# Patient Record
Sex: Male | Born: 1990 | Hispanic: No | Marital: Single | Smoking: Current every day smoker
Health system: Southern US, Community
[De-identification: ages and names within clinical notes are randomized; demographics above are authoritative.]

## PROBLEM LIST (undated history)

## (undated) DIAGNOSIS — G939 Disorder of brain, unspecified: Secondary | ICD-10-CM

## (undated) DIAGNOSIS — F3189 Other bipolar disorder: Secondary | ICD-10-CM

## (undated) DIAGNOSIS — F840 Autistic disorder: Secondary | ICD-10-CM

## (undated) DIAGNOSIS — F419 Anxiety disorder, unspecified: Secondary | ICD-10-CM

## (undated) DIAGNOSIS — R6250 Unspecified lack of expected normal physiological development in childhood: Secondary | ICD-10-CM

## (undated) DIAGNOSIS — F4310 Post-traumatic stress disorder, unspecified: Secondary | ICD-10-CM

## (undated) DIAGNOSIS — F319 Bipolar disorder, unspecified: Secondary | ICD-10-CM

## (undated) DIAGNOSIS — J45909 Unspecified asthma, uncomplicated: Secondary | ICD-10-CM

## (undated) HISTORY — PX: SHOULDER SURGERY: SHX246

## (undated) HISTORY — PX: HERNIA REPAIR: SHX51

---
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
## 2015-07-29 ENCOUNTER — Encounter: Payer: Self-pay | Admitting: Emergency Medicine

## 2015-07-29 ENCOUNTER — Emergency Department
Admission: EM | Admit: 2015-07-29 | Discharge: 2015-07-29 | Disposition: A | Discharge status: Home / Self Care | Payer: Medicaid Other | Attending: Emergency Medicine | Admitting: Emergency Medicine

## 2015-07-29 DIAGNOSIS — F17210 Nicotine dependence, cigarettes, uncomplicated: Secondary | ICD-10-CM | POA: Diagnosis not present

## 2015-07-29 DIAGNOSIS — F319 Bipolar disorder, unspecified: Secondary | ICD-10-CM | POA: Diagnosis not present

## 2015-07-29 DIAGNOSIS — M222X1 Patellofemoral disorders, right knee: Secondary | ICD-10-CM | POA: Diagnosis not present

## 2015-07-29 DIAGNOSIS — M25561 Pain in right knee: Secondary | ICD-10-CM | POA: Diagnosis present

## 2015-07-29 DIAGNOSIS — Z79899 Other long term (current) drug therapy: Secondary | ICD-10-CM | POA: Insufficient documentation

## 2015-07-29 DIAGNOSIS — J45909 Unspecified asthma, uncomplicated: Secondary | ICD-10-CM | POA: Insufficient documentation

## 2015-07-29 HISTORY — DX: Unspecified asthma, uncomplicated: J45.909

## 2015-07-29 HISTORY — DX: Bipolar disorder, unspecified: F31.9

## 2015-07-29 HISTORY — DX: Unspecified lack of expected normal physiological development in childhood: R62.50

## 2015-07-29 HISTORY — DX: Other bipolar disorder: F31.89

## 2015-07-29 MED ORDER — MELOXICAM 15 MG PO TABS: 15.0000 mg | ORAL_TABLET | Freq: Every day | ORAL | Status: DC

## 2015-07-29 NOTE — ED Notes (Signed)
Having generalized right knee pain w/o injury for 2 days

## 2015-07-29 NOTE — Discharge Instructions (Signed)
Patellofemoral Pain Syndrome  Patellofemoral pain syndrome is a condition that involves a softening or breakdown of the tissue (cartilage) on the underside of your kneecap (patella). This causes pain in the front of the knee. The condition is also called runner's knee or chondromalacia patella. Patellofemoral pain syndrome is most common in young adults who are active in sports.  Your knee is the largest joint in your body. The patella covers the front of your knee and is attached to muscles above and below your knee. The underside of the patella is covered with a smooth type of cartilage (synovium). The smooth surface helps the patella glide easily when you move your knee. Patellofemoral pain syndrome causes swelling in the joint linings and bone surfaces in your knee.   CAUSES   Patellofemoral pain syndrome can be caused by:   Overuse.   Poor alignment of your knee joints.   Weak leg muscles.   A direct blow to your kneecap.  RISK FACTORS  You may be at risk for patellofemoral pain syndrome if you:   Do a lot of activities that can wear down your kneecap. These include:    Running.    Squatting.    Climbing stairs.   Start a new physical activity or exercise program.   Wear shoes that do not fit well.   Do not have good leg strength.   Are overweight.  SIGNS AND SYMPTOMS   Knee pain is the most common symptom of patellofemoral pain syndrome. This may feel like a dull, aching pain underneath your patella, in the front of your knee. There may be a popping or cracking sound when you move your knee. Pain may get worse with:   Exercise.   Climbing stairs.   Running.   Jumping.   Squatting.   Kneeling.   Sitting for a long time.   Moving or pushing on your patella.  DIAGNOSIS   Your health care provider may be able to diagnose patellofemoral pain syndrome from your symptoms and medical history. You may be asked about your recent physical activities and which ones cause knee pain. Your health care  provider may do a physical exam with certain tests to confirm the diagnosis. These may include:   Moving your patella back and forth.   Checking your range of knee motion.   Having you squat or jump to see if you have pain.   Checking the strength of your leg muscles.  An MRI of the knee may also be done.  TREATMENT   Patellofemoral pain syndrome can usually be treated at home with rest, ice, compression, and elevation (RICE). Other treatments may include:   Nonsteroidal anti-inflammatory drugs (NSAIDs).   Physical therapy to stretch and strengthen your leg muscles.   Shoe inserts (orthotics) to take stress off your knee.   A knee brace or knee support.   Surgery to remove damaged cartilage or move the patella to a better position. The need for surgery is rare.  HOME CARE INSTRUCTIONS    Take medicines only as directed by your health care provider.   Rest your knee.   When resting, keep your knee raised above the level of your heart.   Avoid activities that cause knee pain.   Apply ice to the injured area:   Put ice in a plastic bag.   Place a towel between your skin and the bag.   Leave the ice on for 20 minutes, 2-3 times a day.   Use splints,   braces, knee supports, or walking aids as directed by your health care provider.   Perform stretching and strengthening exercises as directed by your health care provider or physical therapist.   Keep all follow-up visits as directed by your health care provider. This is important.  SEEK MEDICAL CARE IF:    Your symptoms get worse.   You are not improving with home care.  MAKE SURE YOU:   Understand these instructions.   Will watch your condition.   Will get help right away if you are not doing well or get worse.     This information is not intended to replace advice given to you by your health care provider. Make sure you discuss any questions you have with your health care provider.     Document Released: 02/25/2009 Document Revised: 03/30/2014  Document Reviewed: 05/29/2013  Elsevier Interactive Patient Education 2016 [employer]

## 2015-07-29 NOTE — ED Provider Notes (Signed)
[facility] Emergency Department Provider Note ____________________________________________  Time seen: Approximately 5:49 PM  I have reviewed the triage vital signs and the nursing notes.   HISTORY  Chief Complaint Knee Pain    HPI Jerry Benton is a 25 y.o. male who presents to the emergency department for evaluation of right knee pain. He denies injury. He states that the pain started suddenly 2-3 days ago after jumping on a trampoline. He has not taken anything to try and ease the pain. He denies previous knee pain or injury.  Past Medical History  Diagnosis Date  . Asthma   . Bipolar 1 disorder (HCC)   . Developmental delay   . 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   Past Surgical History  Procedure Laterality Date  . Shoulder surgery    . Hernia repair      Current Outpatient Rx  Name  Route  Sig  Dispense  Refill  . paliperidone (INVEGA) 9 MG 24 hr tablet   Oral   Take 9 mg by mouth every morning.         . traZODone (DESYREL) 100 MG tablet   Oral   Take 100 mg by mouth at bedtime.         . meloxicam (MOBIC) 15 MG tablet   Oral   Take 1 tablet (15 mg total) by mouth daily.   30 tablet   0     Allergies Lamictal and Lithium  No family history on file.  Social History Social History  Substance Use Topics  . Smoking status: Current Every Day Smoker -- 1.00 packs/day    Types: Cigarettes  . Smokeless tobacco: None  . Alcohol Use: No    Review of Systems Constitutional: No recent illness. Cardiovascular: Denies chest pain or palpitations. Respiratory: Denies shortness of breath. Musculoskeletal: Pain in Right knee Skin: Negative for rash, wound, lesion. Neurological: Negative for focal weakness or numbness.  ____________________________________________   PHYSICAL EXAM:  VITAL SIGNS: ED Triage Vitals  Enc Vitals Group     BP --       Pulse --      Resp --      Temp --      Temp src --      SpO2 --      Weight 07/29/15 1706 166 lb (75.297 kg)     Height 07/29/15 1706  (1.676 m)     Head Cir --      Peak Flow --      Pain Score 07/29/15 1706 8     Pain Loc --      Pain Edu? --      Excl. in GC? --     Constitutional: Alert and oriented. Well appearing and in no acute distress. Eyes: Conjunctivae are normal. EOMI. Head: Atraumatic. Neck: No stridor.  Respiratory: Normal respiratory effort.   Musculoskeletal: Full range of motion of the right knee, however the patella does not appear to track midline. There is no laxity in the joint. Neurologic:  Normal speech and language. No gross focal neurologic deficits are appreciated. Speech is normal. No gait instability. Skin:  Skin is warm, dry and intact. Atraumatic. Psychiatric: Mood and affect are normal. Speech and behavior are normal.  ____________________________________________   LABS (all labs ordered are listed, but only abnormal results are displayed)  Labs Reviewed - No data to display ____________________________________________  RADIOLOGY  Not indicated ____________________________________________   PROCEDURES  Procedure(s) performed: None   ____________________________________________   INITIAL IMPRESSION / ASSESSMENT AND PLAN / ED COURSE  Pertinent labs & imaging results that were available during my care of the patient were reviewed by me and considered in my medical decision making (see chart for details).  Patient was given a prescription for meloxicam and advised to purchase a knee sleeve. Patient was advised to follow-up with orthopedics for symptoms that are not improving over the next couple weeks. He was encouraged to return to the emergency department for symptoms change or worsen if he is unable schedule an appointment. Patient was able to ambulate out of the department with a steady gait in  assisted. ____________________________________________   FINAL CLINICAL IMPRESSION(S) / ED DIAGNOSES  Final diagnoses:  Patellofemoral syndrome, right       Chinita PesterCari B Katrell Milhorn, FNP 07/30/15 0019  Loleta Roseory Forbach, MD 07/30/15 [phone]

## 2015-07-29 NOTE — ED Notes (Signed)
Right knee pain that began a few days ago, states ibu and tylenol did not help, states swellling as well, denies injury to area. Walked to triage with no limp.

## 2015-08-14 ENCOUNTER — Encounter: Payer: Self-pay | Admitting: Emergency Medicine

## 2015-08-14 ENCOUNTER — Emergency Department
Admission: EM | Admit: 2015-08-14 | Discharge: 2015-08-15 | Disposition: A | Discharge status: Home / Self Care | Payer: Medicaid Other | Attending: Emergency Medicine | Admitting: Emergency Medicine

## 2015-08-14 DIAGNOSIS — Z791 Long term (current) use of non-steroidal anti-inflammatories (NSAID): Secondary | ICD-10-CM | POA: Diagnosis not present

## 2015-08-14 DIAGNOSIS — F4325 Adjustment disorder with mixed disturbance of emotions and conduct: Secondary | ICD-10-CM | POA: Diagnosis not present

## 2015-08-14 DIAGNOSIS — F17210 Nicotine dependence, cigarettes, uncomplicated: Secondary | ICD-10-CM | POA: Diagnosis not present

## 2015-08-14 DIAGNOSIS — R451 Restlessness and agitation: Secondary | ICD-10-CM | POA: Diagnosis not present

## 2015-08-14 DIAGNOSIS — F319 Bipolar disorder, unspecified: Secondary | ICD-10-CM | POA: Diagnosis not present

## 2015-08-14 DIAGNOSIS — F1210 Cannabis abuse, uncomplicated: Secondary | ICD-10-CM

## 2015-08-14 DIAGNOSIS — F3110 Bipolar disorder, current episode manic without psychotic features, unspecified: Secondary | ICD-10-CM

## 2015-08-14 DIAGNOSIS — R6250 Unspecified lack of expected normal physiological development in childhood: Secondary | ICD-10-CM

## 2015-08-14 DIAGNOSIS — F918 Other conduct disorders: Secondary | ICD-10-CM | POA: Diagnosis present

## 2015-08-14 LAB — CBC WITH DIFFERENTIAL/PLATELET
Basophils Absolute: 0 10*3/uL (ref 0–0.1)
Basophils Relative: 0 %
Eosinophils Absolute: 0.1 10*3/uL (ref 0–0.7)
Eosinophils Relative: 1 %
HEMATOCRIT: 41.7 % (ref 40.0–52.0)
HEMOGLOBIN: 14 g/dL (ref 13.0–18.0)
LYMPHS ABS: 1.6 10*3/uL (ref 1.0–3.6)
Lymphocytes Relative: 15 %
MCH: 26.6 pg (ref 26.0–34.0)
MCHC: 33.6 g/dL (ref 32.0–36.0)
MCV: 79.1 fL — ABNORMAL LOW (ref 80.0–100.0)
Monocytes Absolute: 0.8 10*3/uL (ref 0.2–1.0)
NEUTROS ABS: 7.7 10*3/uL — AB (ref 1.4–6.5)
Platelets: 149 10*3/uL — ABNORMAL LOW (ref 150–440)
RBC: 5.27 MIL/uL (ref 4.40–5.90)
RDW: 13.8 % (ref 11.5–14.5)
WBC: 10.1 10*3/uL (ref 3.8–10.6)

## 2015-08-14 LAB — COMPREHENSIVE METABOLIC PANEL
ALBUMIN: 4.3 g/dL (ref 3.5–5.0)
ALK PHOS: 86 U/L (ref 38–126)
ALT: 33 U/L (ref 17–63)
ANION GAP: 6 (ref 5–15)
AST: 40 U/L (ref 15–41)
BILIRUBIN TOTAL: 0.4 mg/dL (ref 0.3–1.2)
BUN: 9 mg/dL (ref 6–20)
CALCIUM: 9.1 mg/dL (ref 8.9–10.3)
CO2: 28 mmol/L (ref 22–32)
CREATININE: 0.86 mg/dL (ref 0.61–1.24)
Chloride: 106 mmol/L (ref 101–111)
GFR calc non Af Amer: >60 mL/min (ref >60)
GLUCOSE: 87 mg/dL (ref 65–99)
Potassium: 3.9 mmol/L (ref 3.5–5.1)
Sodium: 140 mmol/L (ref 135–145)
TOTAL PROTEIN: 7.4 g/dL (ref 6.5–8.1)

## 2015-08-14 LAB — URINE DRUG SCREEN, QUALITATIVE (ARMC ONLY)
AMPHETAMINES, UR SCREEN: NOT DETECTED
BARBITURATES, UR SCREEN: NOT DETECTED
BENZODIAZEPINE, UR SCRN: POSITIVE — AB
Cannabinoid 50 Ng, Ur ~~LOC~~: POSITIVE — AB
Cocaine Metabolite,Ur ~~LOC~~: NOT DETECTED
MDMA (Ecstasy)Ur Screen: NOT DETECTED
Methadone Scn, Ur: NOT DETECTED
OPIATE, UR SCREEN: NOT DETECTED
PHENCYCLIDINE (PCP) UR S: NOT DETECTED
Tricyclic, Ur Screen: NOT DETECTED

## 2015-08-14 LAB — ETHANOL

## 2015-08-14 MED ORDER — ZIPRASIDONE MESYLATE 20 MG IM SOLR
20.0000 mg | Freq: Once | INTRAMUSCULAR | Status: AC
  Administered 2015-08-14: 20 mg via INTRAMUSCULAR
  Filled 2015-08-14: qty 20

## 2015-08-14 MED ORDER — DIAZEPAM 5 MG PO TABS
10.0000 mg | ORAL_TABLET | Freq: Once | ORAL | Status: AC
  Administered 2015-08-14: 10 mg via ORAL
  Filled 2015-08-14: qty 2

## 2015-08-14 NOTE — ED Notes (Addendum)
Pt states "I'm walking out of here in 30 minutes, I want my stuff back, I got people to text." Pt cursing, becoming agitated. Pt stating "I don't need to be here, I just got mad." This RN talked to pt, pt cursed at this RN and raised voice.

## 2015-08-14 NOTE — BHH Counselor (Signed)
1st Attempt - Unsuccessful attempt to assess pt due to pt being asleep.   Per Isla PenceQuad Nurse Florentina Addison(Katie, RN), pt exhibiting verbal aggressive behaviors.

## 2015-08-14 NOTE — ED Provider Notes (Addendum)
[facility] Emergency Department Provider Note        Time seen: ----------------------------------------- 6:41 PM on 08/14/2015 -----------------------------------------    I have reviewed the triage vital signs and the nursing notes.   HISTORY  Chief Complaint Aggressive Behavior    HPI Jerry Benton is a 25 y.o. male who presents to ER after his mother called police to 10 throwing things in her house. Patient reports she is off his medications and he is angry because there is a girl in the neighborhood that is accusing him of things that he did not do. Patient reports he is taking his medicines although his mother disputes this. He arrives agitated. He threw a cell phone across the room on arrival.   Past Medical History  Diagnosis Date  . Asthma   . Bipolar 1 disorder (HCC)   . Developmental delay   . 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   Past Surgical History  Procedure Laterality Date  . Shoulder surgery    . Hernia repair      Allergies Lamictal and Lithium  Social History Social History  Substance Use Topics  . Smoking status: Current Every Day Smoker -- 1.00 packs/day    Types: Cigarettes  . Smokeless tobacco: None  . Alcohol Use: No    Review of Systems Constitutional: Negative for fever. Cardiovascular: Negative for chest pain. Respiratory: Negative for shortness of breath. Gastrointestinal: Negative for abdominal pain, vomiting and diarrhea. Genitourinary: Negative for dysuria. Musculoskeletal: Negative for back pain. Skin: Negative for rash. Neurological: Negative for headaches, focal weakness or numbness. Psychiatric: Positive for agitation  10-point ROS otherwise negative.  ____________________________________________   PHYSICAL EXAM:  VITAL SIGNS: ED Triage Vitals  Enc Vitals Group     BP 08/14/15 1833 124/69 mmHg     Pulse  Rate 08/14/15 1833 92     Resp 08/14/15 1833 16     Temp 08/14/15 1833 98 F (36.7 C)     Temp Source 08/14/15 1833 Oral     SpO2 08/14/15 1833 97 %     Weight 08/14/15 1833 152 lb (68.947 kg)     Height 08/14/15 1833 5\' 6"  (1.676 m)     Head Cir --      Peak Flow --      Pain Score --      Pain Loc --      Pain Edu? --      Excl. in GC? --     Constitutional: Alert and oriented, Agitated Eyes: Conjunctivae are normal. PERRL. Normal extraocular movements. ENT   Head: Normocephalic and atraumatic.   Nose: No congestion/rhinnorhea.   Mouth/Throat: Mucous membranes are moist.   Neck: No stridor. Cardiovascular: Normal rate, regular rhythm. No murmurs, rubs, or gallops. Respiratory: Normal respiratory effort without tachypnea nor retractions. Breath sounds are clear and equal bilaterally. No wheezes/rales/rhonchi. Gastrointestinal: Soft and nontender. Normal bowel sounds Musculoskeletal: Nontender with normal range of motion in all extremities. No lower extremity tenderness nor edema. Neurologic:  Normal speech and language. No gross focal neurologic deficits are appreciated.  Skin:  Skin is warm, dry and intact. No rash noted. Psychiatric: Elevated mood and affect, confrontational ____________________________________________  ED COURSE:  Pertinent labs & imaging results that were available during my care of the patient were reviewed by me and considered in my medical decision making (see chart for details). Patient presents with aggressive behavior. He'll  receive oral Valium, I will consult psychiatry. ____________________________________________    LABS (pertinent positives/negatives)  Labs Reviewed  CBC WITH DIFFERENTIAL/PLATELET - Abnormal; Notable for the following:    MCV 79.1 (*)    Platelets 149 (*)    Neutro Abs 7.7 (*)    All other components within normal limits  URINE DRUG SCREEN, QUALITATIVE ([facility] ONLY) - Abnormal; Notable for the following:     Cannabinoid 50 Ng, Ur [place] POSITIVE (*)    Benzodiazepine, Ur Scrn POSITIVE (*)    All other components within normal limits  COMPREHENSIVE METABOLIC PANEL  ETHANOL   ____________________________________________  FINAL ASSESSMENT AND PLAN  Agitation, bipolar disorder  Plan: Patient with labs as dictated above. Patient is medically stable for psychiatric evaluation and disposition.   Emily Filbert, MD   Note: This dictation was prepared with Dragon dictation. Any transcriptional errors that result from this process are unintentional   Emily Filbert, MD 08/14/15 1846  Emily Filbert, MD 08/14/15 [phone]

## 2015-08-14 NOTE — ED Notes (Signed)
Pt here due to mother calling police because pt was throwing stuff in her house. Reports pt is off his meds. Pt reports he's angry because there's a neighborhood dispute, reports he's been taking his medications as prescribed. Pt alert upon arrival, agitated. Pt threw cell phone across room.

## 2015-08-15 DIAGNOSIS — R6250 Unspecified lack of expected normal physiological development in childhood: Secondary | ICD-10-CM

## 2015-08-15 DIAGNOSIS — F1210 Cannabis abuse, uncomplicated: Secondary | ICD-10-CM

## 2015-08-15 DIAGNOSIS — F4325 Adjustment disorder with mixed disturbance of emotions and conduct: Secondary | ICD-10-CM | POA: Diagnosis not present

## 2015-08-15 DIAGNOSIS — F319 Bipolar disorder, unspecified: Secondary | ICD-10-CM

## 2015-08-15 NOTE — ED Provider Notes (Addendum)
Dr. Mat Carnelay packs sees the patient. he is calm and not a threat to anyone Dr. Mat Carnelay packs we'll discharge patient back to his care under the act team.  Arnaldo NatalPaul F Malinda, MD 08/15/15 1157  Arnaldo NatalPaul F Malinda, MD 08/15/15 [phone]

## 2015-08-15 NOTE — ED Notes (Signed)
BEHAVIORAL HEALTH ROUNDING Patient sleeping: No. Patient alert and oriented: yes Behavior appropriate: Yes.  ; If no, describe:  Nutrition and fluids offered: yes Toileting and hygiene offered: Yes  Sitter present: q15 minute observations and security  monitoring Law enforcement present: Yes  ODS  

## 2015-08-15 NOTE — ED Notes (Signed)
I was told by his mother that she will be here at 1430 to pick him up -  I have not heard from her  ACT team member came here instead and Jerry Benton spoke with her  - [facility]  ACT team member reports that she will be back at 1600 to get him and take him home

## 2015-08-15 NOTE — ED Provider Notes (Signed)
-----------------------------------------   8:15 AM on 08/15/2015 -----------------------------------------   Blood pressure 107/69, pulse 100, temperature 97.6 F (36.4 C), temperature source Oral, resp. rate 18, height 5\' 6"  (1.676 m), weight 152 lb (68.947 kg), SpO2 96 %.  The patient had no acute events since last update.  Calm and cooperative at this time.  Disposition is pending per Psychiatry/Behavioral Medicine team recommendations.     Arnaldo NatalPaul F Malinda, MD 08/15/15 [phone]

## 2015-08-15 NOTE — ED Notes (Signed)
Pt sleeping on stretcher, blanket covering pt, side rails up on both sides. Lights dimmed for comfort.

## 2015-08-15 NOTE — ED Notes (Signed)
Pt sleeping on back.

## 2015-08-15 NOTE — ED Notes (Signed)
Pt observed lying in bed - watching TV   Pt visualized with NAD  No verbalized needs or concerns at this time  Continue to monitor 

## 2015-08-15 NOTE — ED Notes (Signed)
Pt awake and ambulatory in hallway. Pt asked for something to drink, was provided with drink per request. Pt went back into room.

## 2015-08-15 NOTE — ED Notes (Signed)
Spoke with his mother on the phone -  He had informed her of his pending discharge  Mother verbalizing anger to me on the phone - I informed her that the pt has been cleared by the emergency psychiatrist and that he was safe to leave - pt remains voluntary  Mother reports that she will be here at approx 1430 to pick him up

## 2015-08-15 NOTE — ED Notes (Signed)
TTS consult completed  My assessment completed  He denies pain  I have provided him with the phone to call his mother  -    He can be heard yelling at his mother on the phone  - demanding irritated voice

## 2015-08-15 NOTE — ED Notes (Signed)
ED BHU PLACEMENT JUSTIFICATION Is the patient under IVC or is there intent for IVC:  Voluntary  Is the patient medically cleared: Yes.   Is there vacancy in the ED BHU: Yes.   Is the population mix appropriate for patient: Yes.   Is the patient awaiting placement in inpatient or outpatient setting:  Has the patient had a psychiatric consult:  Consult pending Survey of unit performed for contraband, proper placement and condition of furniture, tampering with fixtures in bathroom, shower, and each patient room: Yes.  ; Findings:  APPEARANCE/BEHAVIOR Demanding - slammed the door to the BR as he went in  NEURO ASSESSMENT Orientation: oriented x4  Denies pain Hallucinations: No.None noted (Hallucinations) Speech:  Loud - yelling at times Gait: normal RESPIRATORY ASSESSMENT Even  Unlabored respirations  CARDIOVASCULAR ASSESSMENT Pulses equal   regular rate  Skin warm and dry   GASTROINTESTINAL ASSESSMENT no GI complaint EXTREMITIES Full ROM  PLAN OF CARE Provide calm/safe environment. Vital signs assessed twice daily. ED BHU Assessment once each 12-hour shift. Collaborate with intake RN daily or as condition indicates. Assure the ED provider has rounded once each shift. Provide and encourage hygiene. Provide redirection as needed. Assess for escalating behavior; address immediately and inform ED provider.  Assess family dynamic and appropriateness for visitation as needed: Yes.  ; If necessary, describe findings:  Educate the patient/family about BHU procedures/visitation: Yes.  ; If necessary, describe findings:

## 2015-08-15 NOTE — Consult Note (Signed)
[facility] Face-to-Face Psychiatry Consult   Reason for Consult:  Consult for this 25 year old man brought in voluntarily after he became very angry and agitated at home Referring Physician:  Cinda Quest Patient Identification: Jerry Benton MRN:  [id number] Principal Diagnosis: Adjustment disorder with mixed disturbance of emotions and conduct Diagnosis:   Patient Active Problem List   Diagnosis Date Noted  . Bipolar 1 disorder ([place]) [F31.9] 08/15/2015  . Developmental delay [R62.50] 08/15/2015  . Adjustment disorder with mixed disturbance of emotions and conduct [F43.25] 08/15/2015  . Cannabis abuse [F12.10] 08/15/2015    Total Time spent with patient: 1 hour  Subjective:   Jerry Benton is a 25 y.o. male patient admitted with "nothing really, I just got mad".  HPI:  Patient interviewed. Chart reviewed. Labs and vitals reviewed. Case discussed with TTS and emergency room physician. 25 year old man with a history of self-reported bipolar disorder who was brought here by law enforcement after his mother called 29. Patient says that he was in his usual mental state and not feeling particularly agitated or moody until yesterday. 69-year-old girl in the neighborhood started making claims that he, the patient, had done some kind of inappropriate sexual things with her. The patient absolutely denies that this happens and says that girl is a well-known Control and instrumentation engineer. He became angry and lost his temper. He went back to his own bedroom and threw his telephone against the wall and poor up some of his own belongings. There is no report that he was aggressive towards anyone. Patient denies any hostile feelings or intent towards any other people. He denies any suicidal ideation. Says that he has calm down now. He is compliant with his prescribed medicine. Admits that he uses marijuana daily but says he has been off of crack for at least a couple of weeks. Doesn't drink alcohol. Doesn't report any other acute stressor. Sleeping  adequately and normally. No new health problems.  Social history: Patient is chronically disabled. He is living with his mother and stepfather. He just moved up. [facility] fairly recently. Previously he had been living in [place] with his father but his drug problem was out of control down there which is why he has relocated.  Medical history: Mild asthma but doesn't even required an inhaler anymore.  Substance abuse history: Patient says he was using crack pretty heavily down in [place]. Behavior was getting more out of control. He relocated to be near his mother to try and stop using drugs. He says he's been off the cocaine for at least several weeks now. He admits that he continues to use marijuana which he seems to believe is therapeutic. Denies other drugs and denies alcohol abuse.  Past Psychiatric History: Long-standing history of mood instability diagnosis of bipolar disorder. 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 He tells me he has had multiple psychiatric hospitalizations over the years. 2 prior suicide attempts by overdose. Most recent suicide attempt was a few months ago. Most recent hospitalization was in [place]. He is followed by an act team. He is currently on Invega long-acting injectable medicine and says he relapsed received his shot about a week ago. Previous medicines include Haldol. He denies that he ever gets violent towards anyone else.  Risk to Self: Suicidal Ideation: No Suicidal Intent: No Is patient at risk for suicide?: No Suicidal Plan?: No Access to Means: No What has been your use of drugs/alcohol within the last 12 months?: Past Cocaine use How many times?: 2 Other Self Harm Risks:  Reports of none Triggers for Past Attempts:  (Active Addiction) Intentional Self Injurious Behavior: None Risk to Others: Homicidal Ideation: No Thoughts of Harm to Others: No Current Homicidal Intent: No Current Homicidal Plan:  No Access to Homicidal Means: No Identified Victim: Reports of none History of harm to others?: No Assessment of Violence: On admission Violent Behavior Description: Patient throw a phone while in the ER Does patient have access to weapons?: No Criminal Charges Pending?: No Does patient have a court date: No Prior Inpatient Therapy: Prior Inpatient Therapy: Yes Prior Therapy Dates: 2017 Prior Therapy Facilty/Provider(s): [facility] it [facility] Reason for Treatment: Suicide Attempts Prior Outpatient Therapy: Prior Outpatient Therapy: Yes Prior Therapy Dates: Current Prior Therapy Facilty/Provider(s): [facility] Reason for Treatment: Bipolar Does patient have an ACCT team?: Yes ([facility]) Does patient have Intensive In-House Services?  : No Does patient have [facility] services? : No Does patient have P4CC services?: No  Past Medical History:  Past Medical History  Diagnosis Date  . Asthma   . Bipolar 1 disorder ([place])   . Developmental delay   . Manic affective disorder with recurrent episode [facility])     Past Surgical History  Procedure Laterality Date  . Shoulder surgery    . Hernia repair     Family History: No family history on file. Family Psychiatric  History: Patient states his mother also has bipolar disorder and has had suicide attempts in the past. Social History:  History  Alcohol Use No     History  Drug Use Not on file    Social History   Social History  . Marital Status: Single    Spouse Name: N/A  . Number of Children: N/A  . Years of Education: N/A   Social History Main Topics  . Smoking status: Current Every Day Smoker -- 1.00 packs/day    Types: Cigarettes  . Smokeless tobacco: None  . Alcohol Use: No  . Drug Use: None  . Sexual Activity: Not Asked   Other Topics Concern  . None   Social History Narrative   Additional Social History:    Allergies:   Allergies  Allergen Reactions  . Lamictal  [Lamotrigine] Rash  . Lithium Other (See Comments)    Toxic levels    Labs:  Results for orders placed or performed during the hospital encounter of 08/14/15 (from the past 48 hour(s))  Urine Drug Screen, Qualitative ([facility] only)     Status: Abnormal   Collection Time: 08/14/15  6:35 PM  Result Value Ref Range   Tricyclic, Ur Screen NONE DETECTED NONE DETECTED   Amphetamines, Ur Screen NONE DETECTED NONE DETECTED   MDMA (Ecstasy)Ur Screen NONE DETECTED NONE DETECTED   Cocaine Metabolite,Ur [place] NONE DETECTED NONE DETECTED   Opiate, Ur Screen NONE DETECTED NONE DETECTED   Phencyclidine (PCP) Ur S NONE DETECTED NONE DETECTED   Cannabinoid 50 Ng, Ur [place] POSITIVE (A) NONE DETECTED   Barbiturates, Ur Screen NONE DETECTED NONE DETECTED   Benzodiazepine, Ur Scrn POSITIVE (A) NONE DETECTED   Methadone Scn, Ur NONE DETECTED NONE DETECTED    Comment: (NOTE) 759  Tricyclics, urine               Cutoff 1000 ng/mL 200  Amphetamines, urine             Cutoff 1000 ng/mL 300  MDMA (Ecstasy), urine           Cutoff 500 ng/mL 400  Cocaine Metabolite,  urine       Cutoff 300 ng/mL 500  Opiate, urine                   Cutoff 300 ng/mL 600  Phencyclidine (PCP), urine      Cutoff 25 ng/mL 700  Cannabinoid, urine              Cutoff 50 ng/mL 800  Barbiturates, urine             Cutoff 200 ng/mL 900  Benzodiazepine, urine           Cutoff 200 ng/mL 1000 Methadone, urine                Cutoff 300 ng/mL 1100 1200 The urine drug screen provides only a preliminary, unconfirmed 1300 analytical test result and should not be used for non-medical 1400 purposes. Clinical consideration and professional judgment should 1500 be applied to any positive drug screen result due to possible 1600 interfering substances. A more specific alternate chemical method 1700 must be used in order to obtain a confirmed analytical result.  1800 Gas chromato graphy / mass spectrometry (GC/MS) is the preferred 1900 confirmatory  method.   CBC with Differential/Platelet     Status: Abnormal   Collection Time: 08/14/15  6:40 PM  Result Value Ref Range   WBC 10.1 3.8 - 10.6 K/uL   RBC 5.27 4.40 - 5.90 MIL/uL   Hemoglobin 14.0 13.0 - 18.0 g/dL   HCT 41.7 40.0 - 52.0 %   MCV 79.1 (L) 80.0 - 100.0 fL   MCH 26.6 26.0 - 34.0 pg   MCHC 33.6 32.0 - 36.0 g/dL   RDW 13.8 11.5 - 14.5 %   Platelets 149 (L) 150 - 440 K/uL   Neutrophils Relative % 76% %   Neutro Abs 7.7 (H) 1.4 - 6.5 K/uL   Lymphocytes Relative 15% %   Lymphs Abs 1.6 1.0 - 3.6 K/uL   Monocytes Relative 8% %   Monocytes Absolute 0.8 0.2 - 1.0 K/uL   Eosinophils Relative 1% %   Eosinophils Absolute 0.1 0 - 0.7 K/uL   Basophils Relative 0% %   Basophils Absolute 0.0 0 - 0.1 K/uL  Comprehensive metabolic panel     Status: None   Collection Time: 08/14/15  6:40 PM  Result Value Ref Range   Sodium 140 135 - 145 mmol/L   Potassium 3.9 3.5 - 5.1 mmol/L   Chloride 106 101 - 111 mmol/L   CO2 28 22 - 32 mmol/L   Glucose, Bld 87 65 - 99 mg/dL   BUN 9 6 - 20 mg/dL   Creatinine, Ser 0.86 0.61 - 1.24 mg/dL   Calcium 9.1 8.9 - 10.3 mg/dL   Total Protein 7.4 6.5 - 8.1 g/dL   Albumin 4.3 3.5 - 5.0 g/dL   AST 40 15 - 41 U/L   ALT 33 17 - 63 U/L   Alkaline Phosphatase 86 38 - 126 U/L   Total Bilirubin 0.4 0.3 - 1.2 mg/dL   GFR calc non Af Amer >60 >60 mL/min   GFR calc Af Amer >60 >60 mL/min    Comment: (NOTE) The eGFR has been calculated using the CKD EPI equation. This calculation has not been validated in all clinical situations. eGFR's persistently <60 mL/min signify possible Chronic Kidney Disease.    Anion gap 6 5 - 15  Ethanol     Status: None   Collection Time: 08/14/15  6:40 PM  Result Value  Ref Range   Alcohol, Ethyl (B) <5 <5 mg/dL    Comment:        LOWEST DETECTABLE LIMIT FOR SERUM ALCOHOL IS 5 mg/dL FOR MEDICAL PURPOSES ONLY     No current facility-administered medications for this encounter.   Current Outpatient Prescriptions   Medication Sig Dispense Refill  . meloxicam (MOBIC) 15 MG tablet Take 1 tablet (15 mg total) by mouth daily. 30 tablet 0  . paliperidone (INVEGA) 9 MG 24 hr tablet Take 9 mg by mouth every morning.    . traZODone (DESYREL) 100 MG tablet Take 100 mg by mouth at bedtime.      Musculoskeletal: Strength & Muscle Tone: within normal limits Gait & Station: normal Patient leans: N/A  Psychiatric Specialty Exam: Physical Exam  Nursing note and vitals reviewed. Constitutional: He appears well-developed and well-nourished.  HENT:  Head: Normocephalic and atraumatic.  Eyes: Conjunctivae are normal. Pupils are equal, round, and reactive to light.  Neck: Normal range of motion.  Cardiovascular: Normal rate, regular rhythm and normal heart sounds.   Respiratory: Effort normal and breath sounds normal. No respiratory distress.  GI: Soft.  Musculoskeletal: Normal range of motion.  Neurological: He is alert.  Skin: Skin is warm and dry.  Psychiatric: He has a normal mood and affect. His behavior is normal. Judgment and thought content normal.    Review of Systems  Constitutional: Negative.   HENT: Negative.   Eyes: Negative.   Respiratory: Negative.   Cardiovascular: Negative.   Gastrointestinal: Negative.   Musculoskeletal: Negative.   Skin: Negative.   Neurological: Negative.   Psychiatric/Behavioral: Positive for substance abuse. Negative for depression, suicidal ideas, hallucinations and memory loss. The patient is nervous/anxious. The patient does not have insomnia.     Blood pressure 107/69, pulse 100, temperature 97.6 F (36.4 C), temperature source Oral, resp. rate 18, height '5\' 6"'  (1.676 m), weight 68.947 kg (152 lb), SpO2 96 %.Body mass index is 24.55 kg/(m^2).  General Appearance: Casual  Eye Contact:  Good  Speech:  Normal Rate  Volume:  Normal  Mood:  Euthymic  Affect:  Constricted  Thought Process:  Goal Directed  Orientation:  Full (Time, Place, and Person)  Thought  Content:  Negative  Suicidal Thoughts:  No  Homicidal Thoughts:  No  Memory:  Immediate;   Good Recent;   Fair Remote;   Fair  Judgement:  Fair  Insight:  Fair  Psychomotor Activity:  Normal  Concentration:  Concentration: Fair  Recall:  [employer] of Knowledge:  Fair  Language:  Fair  Akathisia:  No  Handed:  Right  AIMS (if indicated):     Assets:  Communication Skills Desire for Improvement Financial Resources/Insurance Housing Physical Health Resilience Social Support  ADL's:  Intact  Cognition:  Impaired,  Mild  Sleep:        Treatment Plan Summary: Daily contact with patient to assess and evaluate symptoms and progress in treatment, Medication management and Plan Patient evaluated chart reviewed. Patient is currently calm and appropriate in his interaction. There is no indication of any psychotic thinking. He was a little agitated especially verbally when he first arrived in the emergency room but has been calm down for hours. Patient does not appear to be acutely manic or depressed. No indication that he was actually trying to hurt himself or anyone else. Patient has insight into the inappropriateness of tearing up his own property but says he was just angry. The stressor that he describes is  one that would understandably make a person angry. Patient's bipolar disorder seems to be under control and he does not appear to be having an acute manic episode. He does appear to have probably some chronic cognitive impairments although he is capable of holding a perfectly lucid conversation so the degree of disability is clearly mild. Patient believes that his cannabis abuse is not an issue. He does say he is committed to staying off cocaine. Patient does not require inpatient level hospital treatment. He can be discharged from the hospital and will follow-up with his act team. No need for new medicines. Tried to convince him to consider stopping marijuana. No new prescriptions. Case  reviewed with TTS and ER doctor.  Disposition: Patient does not meet criteria for psychiatric inpatient admission. Supportive therapy provided about ongoing stressors. Discussed crisis plan, support from social network, calling 911, coming to the Emergency Department, and calling Suicide Hotline.  Alethia Berthold, MD 08/15/2015 12:00 PM

## 2015-08-15 NOTE — ED Notes (Signed)
Pt observed sleeping under blanket

## 2015-08-15 NOTE — ED Notes (Signed)

## 2015-08-15 NOTE — BH Assessment (Signed)
Writer spoke with the ACT Team Staff ([phone]) and she had questions in regards to the patient. Writer informed her the patient was discharged. ACTT States she received a phone call from the mother stating he had to be picked up at 2:30 and he could not return home. Writer explain to ACTT, mother said she was going to pick the patient up 2:30. ACTT states, the mother have a history of changing things up, in regards to the truth. ACTT states she understands the patient is being discharge and will be back at 4:00pm to pick him up. She had to go to a meeting for another client and will return.

## 2015-08-15 NOTE — ED Notes (Addendum)
His mother called and we provided the pt with the phone  - pt declined speaking with his mother at this time   ED tech spoke with mom and informed her of phone times and visiting hours      TTS and Psych consult pending  Pt is voluntary

## 2015-08-15 NOTE — Discharge Instructions (Signed)
Bipolar Disorder °Bipolar disorder is a mental illness. The term bipolar disorder actually is used to describe a group of disorders that all share varying degrees of emotional highs and lows that can interfere with daily functioning, such as work, school, or relationships. Bipolar disorder also can lead to drug abuse, hospitalization, and suicide. °The emotional highs of bipolar disorder are periods of elation or irritability and high energy. These highs can range from a mild form (hypomania) to a severe form (mania). People experiencing episodes of hypomania may appear energetic, excitable, and highly productive. People experiencing mania may behave impulsively or erratically. They often make poor decisions. They may have difficulty sleeping. The most severe episodes of mania can involve having very distorted beliefs or perceptions about the world and seeing or hearing things that are not real (psychotic delusions and hallucinations).  °The emotional lows of bipolar disorder (depression) also can range from mild to severe. Severe episodes of bipolar depression can involve psychotic delusions and hallucinations. °Sometimes people with bipolar disorder experience a state of mixed mood. Symptoms of hypomania or mania and depression are both present during this mixed-mood episode. °SIGNS AND SYMPTOMS °There are signs and symptoms of the episodes of hypomania and mania as well as the episodes of depression. The signs and symptoms of hypomania and mania are similar but vary in severity. They include: °· Inflated self-esteem or feeling of increased self-confidence. °· Decreased need for sleep. °· Unusual talkativeness (rapid or pressured speech) or the feeling of a need to keep talking. °· Sensation of racing thoughts or constant talking, with quick shifts between topics that may or may not be related (flight of ideas). °· Decreased ability to focus or concentrate. °· Increased purposeful activity, such as work, studies,  or social activity, or nonproductive activity, such as pacing, squirming and fidgeting, or finger and toe tapping. °· Impulsive behavior and use of poor judgment, resulting in high-risk activities, such as having unprotected sex or spending excessive amounts of money. °Signs and symptoms of depression include the following:  °· Feelings of sadness, hopelessness, or helplessness. °· Frequent or uncontrollable episodes of crying. °· Lack of feeling anything or caring about anything. °· Difficulty sleeping or sleeping too much.  °· Inability to enjoy the things you used to enjoy.   °· Desire to be alone all the time.   °· Feelings of guilt or worthlessness.  °· Lack of energy or motivation.   °· Difficulty concentrating, remembering, or making decisions.  °· Change in appetite or weight beyond normal fluctuations. °· Thoughts of death or the desire to harm yourself. °DIAGNOSIS  °Bipolar disorder is diagnosed through an assessment by your caregiver. Your caregiver will ask questions about your emotional episodes. There are two main types of bipolar disorder. People with type I bipolar disorder have manic episodes with or without depressive episodes. People with type II bipolar disorder have hypomanic episodes and major depressive episodes, which are more serious than mild depression. The type of bipolar disorder you have can make an important difference in how your illness is monitored and treated. °Your caregiver may ask questions about your medical history and use of alcohol or drugs, including prescription medication. Certain medical conditions and substances also can cause emotional highs and lows that resemble bipolar disorder (secondary bipolar disorder).  °TREATMENT  °Bipolar disorder is a long-term illness. It is best controlled with continuous treatment rather than treatment only when symptoms occur. The following treatments can be prescribed for bipolar disorders: °· Medication--Medication can be prescribed by  a doctor that   is an expert in treating mental disorders (psychiatrists). Medications called mood stabilizers are usually prescribed to help control the illness. Other medications are sometimes added if symptoms of mania, depression, or psychotic delusions and hallucinations occur despite the use of a mood stabilizer.  Talk therapy--Some forms of talk therapy are helpful in providing support, education, and guidance. A combination of medication and talk therapy is best for managing the disorder over time. A procedure in which electricity is applied to your brain through your scalp (electroconvulsive therapy) is used in cases of severe mania when medication and talk therapy do not work or work too slowly.   This information is not intended to replace advice given to you by your health care provider. Make sure you discuss any questions you have with your health care provider.   Document Released: 06/15/2000 Document Revised: 03/30/2014 Document Reviewed: 04/04/2012 Elsevier Interactive Patient Education 2016 [employer]   Continue to follow-up with ACT team. Take all your medicines as directed. Return as needed.
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
## 2015-08-15 NOTE — ED Notes (Signed)
Patient observed lying in bed with eyes closed  Even, unlabored respirations observed   NAD pt appears to be sleeping  I will continue to monitor along with every 15 minute visual observations and ongoing security monitoring    

## 2016-01-28 ENCOUNTER — Encounter: Payer: Self-pay | Admitting: Emergency Medicine

## 2016-01-28 ENCOUNTER — Emergency Department
Admission: EM | Admit: 2016-01-28 | Discharge: 2016-01-29 | Disposition: A | Discharge status: Home / Self Care | Payer: Medicaid Other | Attending: Emergency Medicine | Admitting: Emergency Medicine

## 2016-01-28 DIAGNOSIS — Z046 Encounter for general psychiatric examination, requested by authority: Secondary | ICD-10-CM

## 2016-01-28 DIAGNOSIS — F840 Autistic disorder: Secondary | ICD-10-CM | POA: Insufficient documentation

## 2016-01-28 DIAGNOSIS — Z79899 Other long term (current) drug therapy: Secondary | ICD-10-CM | POA: Diagnosis not present

## 2016-01-28 DIAGNOSIS — R456 Violent behavior: Secondary | ICD-10-CM | POA: Diagnosis not present

## 2016-01-28 DIAGNOSIS — R258 Other abnormal involuntary movements: Secondary | ICD-10-CM | POA: Diagnosis not present

## 2016-01-28 DIAGNOSIS — F4325 Adjustment disorder with mixed disturbance of emotions and conduct: Secondary | ICD-10-CM | POA: Diagnosis not present

## 2016-01-28 DIAGNOSIS — F17210 Nicotine dependence, cigarettes, uncomplicated: Secondary | ICD-10-CM | POA: Diagnosis not present

## 2016-01-28 DIAGNOSIS — J45909 Unspecified asthma, uncomplicated: Secondary | ICD-10-CM | POA: Insufficient documentation

## 2016-01-28 DIAGNOSIS — R6250 Unspecified lack of expected normal physiological development in childhood: Secondary | ICD-10-CM | POA: Diagnosis present

## 2016-01-28 DIAGNOSIS — F319 Bipolar disorder, unspecified: Secondary | ICD-10-CM | POA: Diagnosis present

## 2016-01-28 LAB — CBC
HCT: 46.6 % (ref 40.0–52.0)
Hemoglobin: 15.6 g/dL (ref 13.0–18.0)
MCH: 25.5 pg — AB (ref 26.0–34.0)
MCHC: 33.6 g/dL (ref 32.0–36.0)
MCV: 75.9 fL — AB (ref 80.0–100.0)
PLATELETS: 132 10*3/uL — AB (ref 150–440)
RBC: 6.13 MIL/uL — ABNORMAL HIGH (ref 4.40–5.90)
RDW: 13.6 % (ref 11.5–14.5)
WBC: 7.8 10*3/uL (ref 3.8–10.6)

## 2016-01-28 LAB — COMPREHENSIVE METABOLIC PANEL
ALT: 22 U/L (ref 17–63)
AST: 31 U/L (ref 15–41)
Albumin: 4.4 g/dL (ref 3.5–5.0)
Alkaline Phosphatase: 122 U/L (ref 38–126)
Anion gap: 8 (ref 5–15)
BILIRUBIN TOTAL: 0.3 mg/dL (ref 0.3–1.2)
BUN: 16 mg/dL (ref 6–20)
CALCIUM: 9.4 mg/dL (ref 8.9–10.3)
CO2: 26 mmol/L (ref 22–32)
CREATININE: 1.24 mg/dL (ref 0.61–1.24)
Chloride: 105 mmol/L (ref 101–111)
Glucose, Bld: 103 mg/dL — ABNORMAL HIGH (ref 65–99)
Potassium: 4.1 mmol/L (ref 3.5–5.1)
Sodium: 139 mmol/L (ref 135–145)
TOTAL PROTEIN: 7.6 g/dL (ref 6.5–8.1)

## 2016-01-28 LAB — URINE DRUG SCREEN, QUALITATIVE (ARMC ONLY)
Amphetamines, Ur Screen: NOT DETECTED
BARBITURATES, UR SCREEN: NOT DETECTED
Benzodiazepine, Ur Scrn: NOT DETECTED
CANNABINOID 50 NG, UR ~~LOC~~: NOT DETECTED
Cocaine Metabolite,Ur ~~LOC~~: NOT DETECTED
MDMA (Ecstasy)Ur Screen: NOT DETECTED
Methadone Scn, Ur: NOT DETECTED
Opiate, Ur Screen: NOT DETECTED
PHENCYCLIDINE (PCP) UR S: NOT DETECTED
Tricyclic, Ur Screen: POSITIVE — AB

## 2016-01-28 LAB — ETHANOL

## 2016-01-28 LAB — ACETAMINOPHEN LEVEL: Acetaminophen (Tylenol), Serum: <10 ug/mL — ABNORMAL LOW (ref 10–30)

## 2016-01-28 LAB — SALICYLATE LEVEL

## 2016-01-28 NOTE — ED Notes (Signed)
Attempt to call report to move pt to the [facility]. Nurse states unable to accept due to  TBI and MR listed in medical chart.

## 2016-01-28 NOTE — ED Notes (Signed)
TTS seen at this time
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
## 2016-01-29 DIAGNOSIS — F4325 Adjustment disorder with mixed disturbance of emotions and conduct: Secondary | ICD-10-CM

## 2016-01-29 MED ORDER — TRAZODONE HCL 100 MG PO TABS: 100.0000 mg | ORAL_TABLET | Freq: Every day | ORAL | Status: DC

## 2016-01-29 MED ORDER — PALIPERIDONE ER 3 MG PO TB24
9.0000 mg | ORAL_TABLET | Freq: Every morning | ORAL | Status: DC
  Administered 2016-01-29: 9 mg via ORAL
  Filled 2016-01-29: qty 3

## 2016-01-29 NOTE — ED Notes (Signed)
IVC PAPERS  RESCINDED  PER  DR  CLAPACS  INFORMED  RN  NELLIE

## 2016-01-29 NOTE — ED Notes (Signed)
BEHAVIORAL HEALTH ROUNDING Patient sleeping: No. Patient alert and oriented: yes Behavior appropriate: Yes.  ; If no, describe:  Nutrition and fluids offered: yes Toileting and hygiene offered: Yes  Sitter present: q15 minute observations and security  monitoring Law enforcement present: Yes  ODS  

## 2016-01-29 NOTE — Discharge Instructions (Signed)
You have been seen in the emergency department for a  psychiatric concern. You have been evaluated both medically as well as psychiatrically. Please follow-up with your outpatient resources provided. Return to the emergency department for any worsening symptoms, or any thoughts of hurting yourself or anyone else so that we may attempt to help you. 

## 2016-01-29 NOTE — ED Provider Notes (Addendum)
-----------------------------------------   1:10 PM on 01/29/2016 -----------------------------------------  patient has been seen by psychiatry. They believe the patient is safe for discharge home at this time.   Minna AntisKevin Abdulkadir Emmanuel, MD 01/29/16 1310    Minna AntisKevin Colin Norment, MD 01/29/16 1310

## 2016-01-29 NOTE — ED Notes (Signed)

## 2016-01-29 NOTE — ED Notes (Signed)
Breakfast given to patient.
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
## 2016-01-29 NOTE — ED Notes (Signed)
Patient observed lying in bed with eyes closed  Even, unlabored respirations observed   NAD pt appears to be sleeping  I will continue to monitor along with every 15 minute visual observations and ongoing security monitoring    

## 2016-01-29 NOTE — ED Notes (Signed)
ED BHU PLACEMENT JUSTIFICATION Is the patient under IVC or is there intent for IVC: Yes.   Is the patient medically cleared: Yes.   Is there vacancy in the ED BHU: Yes.   Is the population mix appropriate for patient: Yes.   Is the patient awaiting placement in inpatient or outpatient setting:   Has the patient had a psychiatric consult:  Awaiting psych consult  Survey of unit performed for contraband, proper placement and condition of furniture, tampering with fixtures in bathroom, shower, and each patient room: Yes.  ; Findings:  APPEARANCE/BEHAVIOR Calm and cooperative to me  He has used the phone and he has been yelling at his mother   NEURO ASSESSMENT Orientation: oriented x4  Denies pain Hallucinations: No.None noted (Hallucinations) Speech: Normal Gait: normal RESPIRATORY ASSESSMENT Even  Unlabored respirations  CARDIOVASCULAR ASSESSMENT Pulses equal   regular rate  Skin warm and dry   GASTROINTESTINAL ASSESSMENT no GI complaint EXTREMITIES Full ROM  PLAN OF CARE Provide calm/safe environment. Vital signs assessed twice daily. ED BHU Assessment once each 12-hour shift. Collaborate with TTS daily or as condition indicates. Assure the ED provider has rounded once each shift. Provide and encourage hygiene. Provide redirection as needed. Assess for escalating behavior; address immediately and inform ED provider.  Assess family dynamic and appropriateness for visitation as needed: Yes.  ; If necessary, describe findings:  Educate the patient/family about BHU procedures/visitation: Yes.  ; If necessary, describe findings:

## 2016-01-29 NOTE — ED Notes (Signed)
Am meds administered as ordered  Assessment completed  Awaiting psych consult

## 2016-01-29 NOTE — ED Notes (Addendum)
[phone] Land- Angela mother called and wanted to have patient admitted to downstairs unit. Informed her that I would pass info along to psychiatrist and give phone number.

## 2016-02-19 ENCOUNTER — Emergency Department
Admission: EM | Admit: 2016-02-19 | Discharge: 2016-02-19 | Disposition: A | Discharge status: Home / Self Care | Payer: Medicaid Other | Attending: Emergency Medicine | Admitting: Emergency Medicine

## 2016-02-19 DIAGNOSIS — Z79899 Other long term (current) drug therapy: Secondary | ICD-10-CM | POA: Insufficient documentation

## 2016-02-19 DIAGNOSIS — Z5321 Procedure and treatment not carried out due to patient leaving prior to being seen by health care provider: Secondary | ICD-10-CM | POA: Insufficient documentation

## 2016-02-19 DIAGNOSIS — J45909 Unspecified asthma, uncomplicated: Secondary | ICD-10-CM | POA: Insufficient documentation

## 2016-02-19 DIAGNOSIS — F17210 Nicotine dependence, cigarettes, uncomplicated: Secondary | ICD-10-CM | POA: Diagnosis not present

## 2016-02-19 DIAGNOSIS — R451 Restlessness and agitation: Secondary | ICD-10-CM | POA: Diagnosis present

## 2016-02-19 NOTE — ED Notes (Signed)
After triage was completed  Pt states  "i am voluntary and I am going to walk the fuck out of here."  I encouraged him to stay due to his increased agitation and anger and he stated  "i don't give a fuck - I am walking out - I had rather test it to see if my momma and stepdad are for real. My psychiatrist is coming in the morning anyway so I am gone."

## 2016-02-19 NOTE — ED Triage Notes (Signed)
Pt arrives today with increased anger and agitation  Pt states  "I got mad at my mom and my ACTT team - my stepdad told me to come over and get cooled off - I need a new medicine to take when I get mad - can y'all give me a new medicine."  My psychiatrist will come to my house tomorrow so maybe I can get some new medicine then
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
## 2016-04-02 ENCOUNTER — Emergency Department
Admission: EM | Admit: 2016-04-02 | Discharge: 2016-04-02 | Disposition: A | Discharge status: Home / Self Care | Payer: Medicaid Other | Attending: Student in an Organized Health Care Education/Training Program | Admitting: Student in an Organized Health Care Education/Training Program

## 2016-04-02 ENCOUNTER — Encounter: Payer: Self-pay | Admitting: Emergency Medicine

## 2016-04-02 DIAGNOSIS — Y829 Unspecified medical devices associated with adverse incidents: Secondary | ICD-10-CM | POA: Diagnosis not present

## 2016-04-02 DIAGNOSIS — F17210 Nicotine dependence, cigarettes, uncomplicated: Secondary | ICD-10-CM | POA: Diagnosis not present

## 2016-04-02 DIAGNOSIS — T887XXA Unspecified adverse effect of drug or medicament, initial encounter: Secondary | ICD-10-CM | POA: Diagnosis not present

## 2016-04-02 DIAGNOSIS — J45909 Unspecified asthma, uncomplicated: Secondary | ICD-10-CM | POA: Diagnosis not present

## 2016-04-02 DIAGNOSIS — Z5321 Procedure and treatment not carried out due to patient leaving prior to being seen by health care provider: Secondary | ICD-10-CM | POA: Diagnosis not present

## 2016-04-02 DIAGNOSIS — T50905A Adverse effect of unspecified drugs, medicaments and biological substances, initial encounter: Secondary | ICD-10-CM | POA: Diagnosis not present

## 2016-04-02 DIAGNOSIS — R42 Dizziness and giddiness: Secondary | ICD-10-CM | POA: Diagnosis not present

## 2016-04-02 LAB — BASIC METABOLIC PANEL
Anion gap: 8 (ref 5–15)
BUN: 11 mg/dL (ref 6–20)
CALCIUM: 9.1 mg/dL (ref 8.9–10.3)
CHLORIDE: 103 mmol/L (ref 101–111)
CO2: 28 mmol/L (ref 22–32)
CREATININE: 0.9 mg/dL (ref 0.61–1.24)
Glucose, Bld: 100 mg/dL — ABNORMAL HIGH (ref 65–99)
Potassium: 3.8 mmol/L (ref 3.5–5.1)
SODIUM: 139 mmol/L (ref 135–145)

## 2016-04-02 LAB — CBC WITH DIFFERENTIAL/PLATELET
Basophils Absolute: 0 10*3/uL (ref 0–0.1)
Basophils Relative: 0 %
EOS ABS: 0.1 10*3/uL (ref 0–0.7)
Eosinophils Relative: 1 %
HCT: 43.7 % (ref 40.0–52.0)
HEMOGLOBIN: 14.5 g/dL (ref 13.0–18.0)
LYMPHS ABS: 1.9 10*3/uL (ref 1.0–3.6)
Lymphocytes Relative: 16 %
MCH: 25.4 pg — AB (ref 26.0–34.0)
MCHC: 33.3 g/dL (ref 32.0–36.0)
MCV: 76.4 fL — ABNORMAL LOW (ref 80.0–100.0)
MONOS PCT: 6 %
Monocytes Absolute: 0.7 10*3/uL (ref 0.2–1.0)
NEUTROS PCT: 77 %
Neutro Abs: 8.9 10*3/uL — ABNORMAL HIGH (ref 1.4–6.5)
PLATELETS: 123 10*3/uL — AB (ref 150–440)
RBC: 5.72 MIL/uL (ref 4.40–5.90)
RDW: 14 % (ref 11.5–14.5)
WBC: 11.6 10*3/uL — AB (ref 3.8–10.6)

## 2016-04-02 LAB — URINE DRUG SCREEN, QUALITATIVE (ARMC ONLY)
Amphetamines, Ur Screen: NOT DETECTED
BARBITURATES, UR SCREEN: NOT DETECTED
BENZODIAZEPINE, UR SCRN: NOT DETECTED
CANNABINOID 50 NG, UR ~~LOC~~: POSITIVE — AB
Cocaine Metabolite,Ur ~~LOC~~: POSITIVE — AB
MDMA (Ecstasy)Ur Screen: NOT DETECTED
METHADONE SCREEN, URINE: NOT DETECTED
Opiate, Ur Screen: NOT DETECTED
Phencyclidine (PCP) Ur S: NOT DETECTED
TRICYCLIC, UR SCREEN: POSITIVE — AB
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
## 2016-10-31 ENCOUNTER — Emergency Department: Payer: Medicaid Other

## 2016-10-31 ENCOUNTER — Emergency Department
Admission: EM | Admit: 2016-10-31 | Discharge: 2016-10-31 | Disposition: A | Discharge status: Home / Self Care | Payer: Medicaid Other | Attending: Emergency Medicine | Admitting: Emergency Medicine

## 2016-10-31 ENCOUNTER — Encounter: Payer: Self-pay | Admitting: Emergency Medicine

## 2016-10-31 DIAGNOSIS — S060X9A Concussion with loss of consciousness of unspecified duration, initial encounter: Secondary | ICD-10-CM

## 2016-10-31 DIAGNOSIS — F10120 Alcohol abuse with intoxication, uncomplicated: Secondary | ICD-10-CM

## 2016-10-31 DIAGNOSIS — F17210 Nicotine dependence, cigarettes, uncomplicated: Secondary | ICD-10-CM | POA: Diagnosis not present

## 2016-10-31 DIAGNOSIS — J45909 Unspecified asthma, uncomplicated: Secondary | ICD-10-CM | POA: Diagnosis not present

## 2016-10-31 DIAGNOSIS — F840 Autistic disorder: Secondary | ICD-10-CM | POA: Insufficient documentation

## 2016-10-31 DIAGNOSIS — Y9389 Activity, other specified: Secondary | ICD-10-CM | POA: Insufficient documentation

## 2016-10-31 DIAGNOSIS — F819 Developmental disorder of scholastic skills, unspecified: Secondary | ICD-10-CM | POA: Diagnosis not present

## 2016-10-31 DIAGNOSIS — Z79899 Other long term (current) drug therapy: Secondary | ICD-10-CM | POA: Insufficient documentation

## 2016-10-31 DIAGNOSIS — S0990XA Unspecified injury of head, initial encounter: Secondary | ICD-10-CM | POA: Diagnosis present

## 2016-10-31 DIAGNOSIS — Y929 Unspecified place or not applicable: Secondary | ICD-10-CM | POA: Diagnosis not present

## 2016-10-31 DIAGNOSIS — Y999 Unspecified external cause status: Secondary | ICD-10-CM | POA: Insufficient documentation

## 2016-10-31 HISTORY — DX: Anxiety disorder, unspecified: F41.9

## 2016-10-31 HISTORY — DX: Autistic disorder: F84.0

## 2016-10-31 HISTORY — DX: Post-traumatic stress disorder, unspecified: F43.10

## 2016-10-31 HISTORY — DX: Disorder of brain, unspecified: G93.9
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
## 2017-01-03 ENCOUNTER — Encounter: Payer: Self-pay | Admitting: *Deleted

## 2017-01-03 ENCOUNTER — Emergency Department: Payer: Medicaid Other

## 2017-01-03 ENCOUNTER — Emergency Department
Admission: EM | Admit: 2017-01-03 | Discharge: 2017-01-04 | Disposition: A | Discharge status: Home / Self Care | Payer: Medicaid Other | Attending: Emergency Medicine | Admitting: Emergency Medicine

## 2017-01-03 DIAGNOSIS — F840 Autistic disorder: Secondary | ICD-10-CM | POA: Diagnosis not present

## 2017-01-03 DIAGNOSIS — R112 Nausea with vomiting, unspecified: Secondary | ICD-10-CM | POA: Diagnosis not present

## 2017-01-03 DIAGNOSIS — R9431 Abnormal electrocardiogram [ECG] [EKG]: Secondary | ICD-10-CM

## 2017-01-03 DIAGNOSIS — R197 Diarrhea, unspecified: Secondary | ICD-10-CM

## 2017-01-03 DIAGNOSIS — I4581 Long QT syndrome: Secondary | ICD-10-CM | POA: Diagnosis not present

## 2017-01-03 DIAGNOSIS — Z79899 Other long term (current) drug therapy: Secondary | ICD-10-CM | POA: Insufficient documentation

## 2017-01-03 DIAGNOSIS — A045 Campylobacter enteritis: Secondary | ICD-10-CM | POA: Diagnosis not present

## 2017-01-03 DIAGNOSIS — F88 Other disorders of psychological development: Secondary | ICD-10-CM | POA: Diagnosis not present

## 2017-01-03 DIAGNOSIS — J45909 Unspecified asthma, uncomplicated: Secondary | ICD-10-CM | POA: Insufficient documentation

## 2017-01-03 DIAGNOSIS — R509 Fever, unspecified: Secondary | ICD-10-CM | POA: Diagnosis present

## 2017-01-03 LAB — GASTROINTESTINAL PANEL BY PCR, STOOL (REPLACES STOOL CULTURE)
Adenovirus F40/41: NOT DETECTED
Astrovirus: NOT DETECTED
CRYPTOSPORIDIUM: NOT DETECTED
CYCLOSPORA CAYETANENSIS: NOT DETECTED
Campylobacter species: DETECTED — AB
ENTAMOEBA HISTOLYTICA: NOT DETECTED
ENTEROAGGREGATIVE E COLI (EAEC): NOT DETECTED
Enteropathogenic E coli (EPEC): NOT DETECTED
Enterotoxigenic E coli (ETEC): NOT DETECTED
GIARDIA LAMBLIA: NOT DETECTED
Norovirus GI/GII: NOT DETECTED
Plesimonas shigelloides: NOT DETECTED
ROTAVIRUS A: NOT DETECTED
SALMONELLA SPECIES: NOT DETECTED
SHIGELLA/ENTEROINVASIVE E COLI (EIEC): NOT DETECTED
Sapovirus (I, II, IV, and V): NOT DETECTED
Shiga like toxin producing E coli (STEC): NOT DETECTED
VIBRIO CHOLERAE: NOT DETECTED
VIBRIO SPECIES: NOT DETECTED
YERSINIA ENTEROCOLITICA: NOT DETECTED

## 2017-01-03 LAB — LIPASE, BLOOD: Lipase: 17 U/L (ref 11–51)

## 2017-01-03 LAB — CBC
HCT: 47.7 % (ref 40.0–52.0)
HEMOGLOBIN: 16 g/dL (ref 13.0–18.0)
MCH: 24.9 pg — AB (ref 26.0–34.0)
MCHC: 33.5 g/dL (ref 32.0–36.0)
MCV: 74.4 fL — ABNORMAL LOW (ref 80.0–100.0)
Platelets: 112 10*3/uL — ABNORMAL LOW (ref 150–440)
RBC: 6.42 MIL/uL — ABNORMAL HIGH (ref 4.40–5.90)
RDW: 14.4 % (ref 11.5–14.5)
WBC: 18 10*3/uL — ABNORMAL HIGH (ref 3.8–10.6)

## 2017-01-03 LAB — COMPREHENSIVE METABOLIC PANEL
ALBUMIN: 4.2 g/dL (ref 3.5–5.0)
ALK PHOS: 105 U/L (ref 38–126)
ALT: 29 U/L (ref 17–63)
ANION GAP: 13 (ref 5–15)
AST: 46 U/L — ABNORMAL HIGH (ref 15–41)
BILIRUBIN TOTAL: 0.8 mg/dL (ref 0.3–1.2)
BUN: 16 mg/dL (ref 6–20)
CALCIUM: 8.9 mg/dL (ref 8.9–10.3)
CO2: 19 mmol/L — AB (ref 22–32)
Chloride: 100 mmol/L — ABNORMAL LOW (ref 101–111)
Creatinine, Ser: 1.41 mg/dL — ABNORMAL HIGH (ref 0.61–1.24)
GFR calc Af Amer: >60 mL/min (ref >60)
GFR calc non Af Amer: >60 mL/min (ref >60)
Glucose, Bld: 170 mg/dL — ABNORMAL HIGH (ref 65–99)
Potassium: 3.1 mmol/L — ABNORMAL LOW (ref 3.5–5.1)
SODIUM: 132 mmol/L — AB (ref 135–145)
TOTAL PROTEIN: 8.1 g/dL (ref 6.5–8.1)

## 2017-01-03 LAB — CBC WITH DIFFERENTIAL/PLATELET
BASOS PCT: 0 %
Basophils Absolute: 0 10*3/uL (ref 0–0.1)
Eosinophils Absolute: 0 10*3/uL (ref 0–0.7)
Eosinophils Relative: 0 %
HEMATOCRIT: 42.7 % (ref 40.0–52.0)
HEMOGLOBIN: 14.2 g/dL (ref 13.0–18.0)
Lymphocytes Relative: 3 %
Lymphs Abs: 0.5 10*3/uL — ABNORMAL LOW (ref 1.0–3.6)
MCH: 24.7 pg — ABNORMAL LOW (ref 26.0–34.0)
MCHC: 33.4 g/dL (ref 32.0–36.0)
MCV: 73.9 fL — ABNORMAL LOW (ref 80.0–100.0)
MONOS PCT: 5 %
Monocytes Absolute: 0.7 10*3/uL (ref 0.2–1.0)
NEUTROS ABS: 13.8 10*3/uL — AB (ref 1.4–6.5)
NEUTROS PCT: 92 %
Platelets: 104 10*3/uL — ABNORMAL LOW (ref 150–440)
RBC: 5.77 MIL/uL (ref 4.40–5.90)
RDW: 14.3 % (ref 11.5–14.5)
WBC: 15.1 10*3/uL — ABNORMAL HIGH (ref 3.8–10.6)

## 2017-01-03 LAB — C DIFFICILE QUICK SCREEN W PCR REFLEX
C DIFFICILE (CDIFF) TOXIN: NEGATIVE
C Diff antigen: NEGATIVE
C Diff interpretation: NOT DETECTED

## 2017-01-03 LAB — LACTIC ACID, PLASMA
LACTIC ACID, VENOUS: 3.6 mmol/L — AB (ref 0.5–1.9)
Lactic Acid, Venous: 1.7 mmol/L (ref 0.5–1.9)

## 2017-01-03 MED ORDER — MAGNESIUM SULFATE 2 GM/50ML IV SOLN
2.0000 g | Freq: Once | INTRAVENOUS | Status: AC
  Administered 2017-01-03: 2 g via INTRAVENOUS
  Filled 2017-01-03: qty 50

## 2017-01-03 MED ORDER — ONDANSETRON 4 MG PO TBDP: 4.0000 mg | ORAL_TABLET | Freq: Three times a day (TID) | ORAL | 0 refills | Status: DC | PRN

## 2017-01-03 MED ORDER — PIPERACILLIN-TAZOBACTAM 3.375 G IVPB 30 MIN
3.3750 g | Freq: Once | INTRAVENOUS | Status: AC
  Administered 2017-01-03: 3.375 g via INTRAVENOUS

## 2017-01-03 MED ORDER — PIPERACILLIN-TAZOBACTAM 3.375 G IVPB
3.3750 g | Freq: Three times a day (TID) | INTRAVENOUS | Status: DC
  Filled 2017-01-03 (×2): qty 50

## 2017-01-03 MED ORDER — IOPAMIDOL (ISOVUE-300) INJECTION 61%
100.0000 mL | Freq: Once | INTRAVENOUS | Status: AC | PRN
  Administered 2017-01-03: 100 mL via INTRAVENOUS

## 2017-01-03 MED ORDER — AZITHROMYCIN 250 MG PO TABS: ORAL_TABLET | ORAL | 0 refills | Status: DC

## 2017-01-03 MED ORDER — DICYCLOMINE HCL 20 MG PO TABS
20.0000 mg | ORAL_TABLET | Freq: Three times a day (TID) | ORAL | 0 refills | Status: DC | PRN
Start: 2017-01-03 — End: 2017-01-04

## 2017-01-03 MED ORDER — ONDANSETRON HCL 4 MG/2ML IJ SOLN
4.0000 mg | Freq: Once | INTRAMUSCULAR | Status: AC
  Administered 2017-01-03: 4 mg via INTRAVENOUS
  Filled 2017-01-03: qty 2

## 2017-01-03 MED ORDER — IOPAMIDOL (ISOVUE-300) INJECTION 61%
30.0000 mL | Freq: Once | INTRAVENOUS | Status: AC | PRN
  Administered 2017-01-03: 30 mL via ORAL

## 2017-01-03 MED ORDER — POTASSIUM CHLORIDE 10 MEQ/100ML IV SOLN
10.0000 meq | Freq: Once | INTRAVENOUS | Status: AC
  Administered 2017-01-03: 10 meq via INTRAVENOUS
  Filled 2017-01-03: qty 100

## 2017-01-03 MED ORDER — SODIUM CHLORIDE 0.9 % IV BOLUS (SEPSIS)
2000.0000 mL | Freq: Once | INTRAVENOUS | Status: AC
  Administered 2017-01-03: 2000 mL via INTRAVENOUS

## 2017-01-03 MED ORDER — PIPERACILLIN-TAZOBACTAM 3.375 G IVPB 30 MIN
INTRAVENOUS | Status: AC
  Filled 2017-01-03: qty 50

## 2017-01-03 MED ORDER — KETOROLAC TROMETHAMINE 30 MG/ML IJ SOLN
15.0000 mg | Freq: Once | INTRAMUSCULAR | Status: AC
  Administered 2017-01-03: 15 mg via INTRAVENOUS
  Filled 2017-01-03: qty 1

## 2017-01-03 NOTE — ED Notes (Signed)
Patient is up to room commode to have a BM. Hat was placed in the commode.

## 2017-01-03 NOTE — ED Notes (Signed)
Dr. Mayford Knife informed of campylobacter results.

## 2017-01-03 NOTE — ED Triage Notes (Signed)
Pt to ED reporting NVD since yesterday. Pt reports generalized body aches and abd pain. Pt reports having been able to keep a Gatorade down but otherwise has not had food or drinks. Pts mother reports they have not been able to keep fever down with tylenol at home. Last tylenol was given 1 hours ago.

## 2017-01-03 NOTE — ED Notes (Signed)
CT called to inform that patient is finished with contrast.

## 2017-01-03 NOTE — Progress Notes (Signed)
Pharmacy Antibiotic Note  Jerry Benton is a 26 y.o. male admitted on 01/03/2017 with intra abdominal .  Pharmacy has been consulted for zosyn dosing.  Plan: Zosyn 3.375g IV q8h (4 hour infusion).  Height:  (170.2 cm) Weight: 190 lb (86.2 kg) IBW/kg (Calculated) : 66.1  Temp (24hrs), Avg:101.5 F (38.6 C), Min:101.3 F (38.5 C), Max:101.6 F (38.7 C)   Recent Labs Lab 01/03/17 1936  WBC 18.0*  CREATININE 1.41*  LATICACIDVEN 3.6*    Estimated Creatinine Clearance: 81.7 mL/min (A) (by C-G formula based on SCr of 1.41 mg/dL (H)).    Allergies  Allergen Reactions  . Lamictal [Lamotrigine] Rash  . Lithium Other (See Comments)    Toxic levels    Antimicrobials this admission: Anti-infectives    Start     Dose/Rate Route Frequency Ordered Stop   01/04/17 0600  piperacillin-tazobactam (ZOSYN) IVPB 3.375 g     3.375 g 12.5 mL/hr over 240 Minutes Intravenous Every 8 hours 01/03/17 2121     01/03/17 2046  piperacillin-tazobactam (ZOSYN) 3-0.375 GM/50ML IVPB    Comments:  Jerry Benton   : cabinet override      01/03/17 2046 01/04/17 0859   01/03/17 2015  piperacillin-tazobactam (ZOSYN) IVPB 3.375 g     3.375 g 100 mL/hr over 30 Minutes Intravenous  Once 01/03/17 2011        Microbiology results: No results found for this or any previous visit (from the past 240 hour(s)).   Thank you for allowing pharmacy to be a part of this patient's care.  Jerry Benton 01/03/2017 9:22 PM
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
## 2017-01-03 NOTE — Discharge Instructions (Addendum)
Please seek medical attention for any high fevers, chest pain, shortness of breath, change in behavior, persistent vomiting, bloody stool or any other new or concerning symptoms.  

## 2017-01-03 NOTE — ED Notes (Signed)
Patient was incontinent to stool in the bed and on the floor. Patient was cleaned up with the help of Sarah ED tech. EVS was called to clean the floor.

## 2017-01-03 NOTE — ED Provider Notes (Signed)
IMPRESSION:  1. Wall thickening of the terminal ileum and rectosigmoid colon.  Changes may indicate inter colitis or inflammatory bowel disease.  Prominent right lower quadrant lymph nodes are probably reactive.  2. Diffuse fatty infiltration of the liver.  3. Calcified granulomas in the spleen. Mild splenic enlargement.        Emily Filbert, MD 01/03/17 2214

## 2017-01-03 NOTE — ED Provider Notes (Addendum)
Patient states he feels better and is requesting a po trial. gastrointestinal panel revealed Campylobacter infection which should be self-limited.   Emily Filbert, MD 01/03/17 2218    Emily Filbert, MD 01/03/17 2228

## 2017-01-04 LAB — RAPID HIV SCREEN (HIV 1/2 AB+AG)
HIV 1/2 Antibodies: NONREACTIVE
HIV-1 P24 Antigen - HIV24: NONREACTIVE

## 2017-01-04 MED ORDER — DICYCLOMINE HCL 20 MG PO TABS: 20.0000 mg | ORAL_TABLET | Freq: Three times a day (TID) | ORAL | 0 refills | Status: DC | PRN

## 2017-01-04 MED ORDER — ACETAMINOPHEN 500 MG PO TABS
ORAL_TABLET | ORAL | Status: AC
  Filled 2017-01-04: qty 2

## 2017-01-04 MED ORDER — PROCHLORPERAZINE MALEATE 10 MG PO TABS: 10.0000 mg | ORAL_TABLET | Freq: Four times a day (QID) | ORAL | 0 refills | Status: DC | PRN

## 2017-01-04 MED ORDER — ACETAMINOPHEN 500 MG PO TABS
1000.0000 mg | ORAL_TABLET | Freq: Once | ORAL | Status: AC
  Administered 2017-01-04: 1000 mg via ORAL

## 2017-01-04 MED ORDER — SODIUM CHLORIDE 0.9 % IV BOLUS (SEPSIS)
1000.0000 mL | Freq: Once | INTRAVENOUS | Status: AC
  Administered 2017-01-04: 1000 mL via INTRAVENOUS

## 2017-01-04 NOTE — ED Notes (Signed)
Mother called to pick up.

## 2017-01-04 NOTE — ED Provider Notes (Signed)
-----------------------------------------   1:56 AM on 01/04/2017 -----------------------------------------  At this point patient states he does feel better. On my exam his abdomen is benign. His lactate had improved after IV fluids. He still however remained tachycardic. I discussed this with the patient and did offer admission for further hydration and observation. I did discuss that the continued tachycardia could be a sign of significant illness. However the patient did state he did not want to be admitted. He would like to go home. I did discuss with the patient importance of continued hydration. I did discuss return precautions with the patient.   Phineas Semen, MD 01/04/17 [phone]

## 2017-01-08 LAB — CULTURE, BLOOD (ROUTINE X 2)
CULTURE: NO GROWTH
CULTURE: NO GROWTH
SPECIAL REQUESTS: ADEQUATE
SPECIAL REQUESTS: ADEQUATE

## 2018-01-22 ENCOUNTER — Emergency Department: Payer: Medicaid Other

## 2018-01-22 ENCOUNTER — Other Ambulatory Visit: Payer: Self-pay

## 2018-01-22 ENCOUNTER — Emergency Department
Admission: EM | Admit: 2018-01-22 | Discharge: 2018-01-22 | Disposition: A | Discharge status: Home / Self Care | Payer: Medicaid Other | Attending: Emergency Medicine | Admitting: Emergency Medicine

## 2018-01-22 ENCOUNTER — Encounter: Payer: Self-pay | Admitting: Emergency Medicine

## 2018-01-22 DIAGNOSIS — F17210 Nicotine dependence, cigarettes, uncomplicated: Secondary | ICD-10-CM | POA: Diagnosis not present

## 2018-01-22 DIAGNOSIS — R1031 Right lower quadrant pain: Secondary | ICD-10-CM | POA: Insufficient documentation

## 2018-01-22 DIAGNOSIS — J45909 Unspecified asthma, uncomplicated: Secondary | ICD-10-CM | POA: Insufficient documentation

## 2018-01-22 DIAGNOSIS — F840 Autistic disorder: Secondary | ICD-10-CM | POA: Diagnosis not present

## 2018-01-22 DIAGNOSIS — R350 Frequency of micturition: Secondary | ICD-10-CM | POA: Diagnosis not present

## 2018-01-22 DIAGNOSIS — Z79899 Other long term (current) drug therapy: Secondary | ICD-10-CM | POA: Diagnosis not present

## 2018-01-22 DIAGNOSIS — R109 Unspecified abdominal pain: Secondary | ICD-10-CM

## 2018-01-22 LAB — COMPREHENSIVE METABOLIC PANEL
ALK PHOS: 100 U/L (ref 38–126)
ALT: 25 U/L (ref 0–44)
ANION GAP: 10 (ref 5–15)
AST: 31 U/L (ref 15–41)
Albumin: 4.6 g/dL (ref 3.5–5.0)
BILIRUBIN TOTAL: 0.7 mg/dL (ref 0.3–1.2)
BUN: 10 mg/dL (ref 6–20)
CALCIUM: 9.7 mg/dL (ref 8.9–10.3)
CO2: 28 mmol/L (ref 22–32)
Chloride: 102 mmol/L (ref 98–111)
Creatinine, Ser: 0.78 mg/dL (ref 0.61–1.24)
Glucose, Bld: 114 mg/dL — ABNORMAL HIGH (ref 70–99)
Potassium: 4.4 mmol/L (ref 3.5–5.1)
SODIUM: 140 mmol/L (ref 135–145)
TOTAL PROTEIN: 8.2 g/dL — AB (ref 6.5–8.1)

## 2018-01-22 LAB — URINALYSIS, COMPLETE (UACMP) WITH MICROSCOPIC
BILIRUBIN URINE: NEGATIVE
Bacteria, UA: NONE SEEN
GLUCOSE, UA: NEGATIVE mg/dL
HGB URINE DIPSTICK: NEGATIVE
KETONES UR: NEGATIVE mg/dL
LEUKOCYTES UA: NEGATIVE
NITRITE: NEGATIVE
Protein, ur: NEGATIVE mg/dL
SPECIFIC GRAVITY, URINE: 1.001 — AB (ref 1.005–1.030)
SQUAMOUS EPITHELIAL / LPF: NONE SEEN (ref 0–5)
WBC UA: NONE SEEN WBC/hpf (ref 0–5)
pH: 7 (ref 5.0–8.0)

## 2018-01-22 LAB — CBC
HCT: 50.2 % (ref 39.0–52.0)
Hemoglobin: 16.5 g/dL (ref 13.0–17.0)
MCH: 26.4 pg (ref 26.0–34.0)
MCHC: 32.9 g/dL (ref 30.0–36.0)
MCV: 80.3 fL (ref 80.0–100.0)
NRBC: 0 % (ref 0.0–0.2)
PLATELETS: 139 10*3/uL — AB (ref 150–400)
RBC: 6.25 MIL/uL — AB (ref 4.22–5.81)
RDW: 12.7 % (ref 11.5–15.5)
WBC: 8.7 10*3/uL (ref 4.0–10.5)

## 2018-01-22 LAB — LIPASE, BLOOD: Lipase: 29 U/L (ref 11–51)

## 2018-01-22 MED ORDER — IOPAMIDOL (ISOVUE-300) INJECTION 61%
100.0000 mL | Freq: Once | INTRAVENOUS | Status: AC | PRN
  Administered 2018-01-22: 100 mL via INTRAVENOUS

## 2018-01-22 MED ORDER — IOHEXOL 300 MG/ML  SOLN
30.0000 mL | Freq: Once | INTRAMUSCULAR | Status: AC | PRN
  Administered 2018-01-22: 30 mL via ORAL

## 2018-01-22 NOTE — Discharge Instructions (Signed)
You were evaluated for abdominal pain and although no certain cause was found today, your exam and evaluation are reassuring in the emerge department today.  Return to emergency department immediately for any worsening condition including black or bloody stools, vomiting, no worsening or uncontrolled abdominal pain, any testicular pain, discharge from the penis, fever, or any other symptoms concerning to you.

## 2018-01-22 NOTE — ED Triage Notes (Signed)
R lower abdominal pain x 2 days, pain with urination.

## 2018-01-22 NOTE — ED Triage Notes (Signed)
First Nurse Note:  C/O RLQ pain and swelling also nausea and vomiting x 2 days.  Patient is AAOx3.  Skin warm and dry. NAD

## 2018-01-22 NOTE — ED Provider Notes (Signed)
[facility] Emergency Department Provider Note ____________________________________________   I have reviewed the triage vital signs and the triage nursing note.  HISTORY  Chief Complaint Abdominal Pain   Historian Patient, and mother in the room  HPI Jerry Benton is a 27 y.o. male presents for evaluation of right sided abdominal pain which started a couple days ago and has been waxing and waning.  Currently moderate in intensity.  States he is also had some frequency of urination, no hematuria.  He states he has some pressure suprapubically.  No history of kidney stones or urinary tract infection.  Mild nausea without vomiting or diarrhea.  No fevers.  No chest pain or shortness of breath.   Eyes testicular pain or swelling or scrotal pain or swelling.  Denies skin rashes.   Past Medical History:  Diagnosis Date  . Anxiety   . Asthma   . Autism   . Bipolar 1 disorder (HCC)   . Brain damage    Front Lobe  . Developmental delay   . 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 PTSD (post-traumatic stress disorder)     Patient Active Problem List   Diagnosis Date Noted  . Bipolar 1 disorder (HCC) 08/15/2015  . Developmental delay 08/15/2015  . 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 Cannabis abuse 08/15/2015    Past Surgical History:  Procedure Laterality Date  . HERNIA REPAIR    . SHOULDER SURGERY      Prior to Admission medications   Medication Sig Start Date End Date Taking? Authorizing Provider  azithromycin (ZITHROMAX Z-PAK) 250 MG tablet Take 2 tablets (500 mg) on  Day 1,  followed by 1 tablet (250 mg) once daily on Days 2 through 5. 01/03/17   Emily Filbert, MD  dicyclomine (BENTYL) 20 MG tablet Take 1 tablet (20 mg total) by mouth 3 (three) times daily as needed for spasms. 01/04/17   Phineas Semen, MD  paliperidone (INVEGA) 9 MG 24 hr tablet Take 9 mg by mouth  every morning.    [provider]  prochlorperazine (COMPAZINE) 10 MG tablet Take 1 tablet (10 mg total) by mouth every 6 (six) hours as needed for nausea or vomiting. 01/04/17   Phineas Semen, MD  traZODone (DESYREL) 100 MG tablet Take 100 mg by mouth at bedtime.    [provider]    Allergies  Allergen Reactions  . Lamictal [Lamotrigine] Rash  . Lithium Other (See Comments)    Toxic levels    No family history on file.  Social History Social History   Tobacco Use  . Smoking status: Current Every Day Smoker    Packs/day: 1.00    Types: Cigarettes  . Smokeless tobacco: Never Used  Substance Use Topics  . Alcohol use: Yes    Comment: Occ  . Drug use: No    Review of Systems  Constitutional: Negative for fever. Eyes: Negative for visual changes. ENT: Negative for sore throat. Cardiovascular: Negative for chest pain. Respiratory: Negative for shortness of breath. Gastrointestinal: Negative for vomiting or diarrhea.  Positive for abdominal pain as per HPI in the right side and right lower quadrant. Genitourinary: As per HPI, positive for frequency of urination without hematuria. Musculoskeletal: Negative for back pain. Skin: Negative for rash. Neurological: Negative for headache.  ____________________________________________   PHYSICAL EXAM:  VITAL SIGNS: ED Triage Vitals [01/22/18 0816]  Enc Vitals Group     BP 136/84  Pulse Rate 92     Resp 18     Temp 97.7 F (36.5 C)     Temp Source Oral     SpO2 99 %     Weight 160 lb (72.6 kg)     Height 5\' 7"  (1.702 m)     Head Circumference      Peak Flow      Pain Score 7     Pain Loc      Pain Edu?      Excl. in GC?      Constitutional: Alert and oriented.  HEENT      Head: Normocephalic and atraumatic.      Eyes: Conjunctivae are normal. Pupils equal and round.       Ears:         Nose: No congestion/rhinnorhea.      Mouth/Throat: Mucous membranes are moist.      Neck: No  stridor. Cardiovascular/Chest: Normal rate, regular rhythm.  No murmurs, rubs, or gallops. Respiratory: Normal respiratory effort without tachypnea nor retractions. Breath sounds are clear and equal bilaterally. No wheezes/rales/rhonchi. Gastrointestinal: Soft. No distention, no guarding, no rebound.  Mild tenderness to palpation suprapubically and lower abdomen more so on the right side without focal McBurney's point tenderness. Genitourinary/rectal:Deferred Musculoskeletal: Nontender with normal range of motion in all extremities. No joint effusions.  No lower extremity tenderness.  No edema. Neurologic:  Normal speech and language. No gross or focal neurologic deficits are appreciated. Skin:  Skin is warm, dry and intact. No rash noted. Psychiatric: Mood and affect are normal. Speech and behavior are normal. Patient exhibits appropriate insight and judgment.   ____________________________________________  LABS (pertinent positives/negatives) I, Governor Rooks, MD the attending physician have reviewed the labs noted below.  Labs Reviewed  COMPREHENSIVE METABOLIC PANEL - Abnormal; Notable for the following components:      Result Value   Glucose, Bld 114 (*)    Total Protein 8.2 (*)    All other components within normal limits  CBC - Abnormal; Notable for the following components:   RBC 6.25 (*)    Platelets 139 (*)    All other components within normal limits  URINALYSIS, COMPLETE (UACMP) WITH MICROSCOPIC - Abnormal; Notable for the following components:   Color, Urine COLORLESS (*)    APPearance CLEAR (*)    Specific Gravity, Urine 1.001 (*)    All other components within normal limits  URINE CULTURE  LIPASE, BLOOD    ____________________________________________    EKG I, Governor Rooks, MD, the attending physician have personally viewed and interpreted all ECGs.  None ____________________________________________  RADIOLOGY   CT abdomen pelvis with contrast, radiologist  report:IMPRESSION: Normal CT of the abdomen pelvis. __________________________________________  PROCEDURES  Procedure(s) performed: None  Procedures  Critical Care performed: None   ____________________________________________  ED COURSE / ASSESSMENT AND PLAN  Pertinent labs & imaging results that were available during my care of the patient were reviewed by me and considered in my medical decision making (see chart for details).   Nonsurgical abdomen, patient is calm and comfortable although he is reporting some suprapubic discomfort and right-sided abdominal tenderness to palpation without focal McBurney's point tenderness.  He is reporting some urinary symptoms, will send blood work and urinalysis and let that guide reevaluation and potential imaging.  Do not seem consistent with testicular torsion, as he is having no testicular pain or swelling.  Patient declined testicular exam.  Laboratory studies are overall reassuring.  Normal white blood cell  count.  Patient reports history of prior kidney failure, BUN and creatinine are okay today.  Elect lites okay.  On reevaluation we discussed that although laboratory studies are less likely pointing towards appendicitis in terms of normal white blood cell count, we discussed obtaining CT scan and after discussion of risk benefits chose to proceed.  CT scan result came back reassuring and negative for cause of pain.  Discussed with patient and his mother, no additional ED work-up at this point time.  We discussed uncertain etiology, but everything looks safe at this point time we discussed return precautions.      CONSULTATIONS:   None   Patient / Family / Caregiver informed of clinical course, medical decision-making process, and agree with plan.   I discussed return precautions, follow-up instructions, and discharge instructions with patient and/or family.  Discharge Instructions : You were evaluated for abdominal pain and  although no certain cause was found today, your exam and evaluation are reassuring in the emerge department today.  Return to emergency department immediately for any worsening condition including black or bloody stools, vomiting, no worsening or uncontrolled abdominal pain, any testicular pain, discharge from the penis, fever, or any other symptoms concerning to you.    ___________________________________________   FINAL CLINICAL IMPRESSION(S) / ED DIAGNOSES   Final diagnoses:  Right lateral abdominal pain      ___________________________________________         Note: This dictation was prepared with Dragon dictation. Any transcriptional errors that result from this process are unintentional    Governor Rooks, MD 01/22/18 1201

## 2018-01-23 LAB — URINE CULTURE: Culture: NO GROWTH

## 2018-04-19 ENCOUNTER — Emergency Department: Payer: Medicaid Other

## 2018-04-19 ENCOUNTER — Encounter: Payer: Self-pay | Admitting: Emergency Medicine

## 2018-04-19 ENCOUNTER — Emergency Department
Admission: EM | Admit: 2018-04-19 | Discharge: 2018-04-19 | Disposition: A | Discharge status: Home / Self Care | Payer: Medicaid Other | Attending: Emergency Medicine | Admitting: Emergency Medicine

## 2018-04-19 DIAGNOSIS — Z79899 Other long term (current) drug therapy: Secondary | ICD-10-CM | POA: Diagnosis not present

## 2018-04-19 DIAGNOSIS — R51 Headache: Secondary | ICD-10-CM | POA: Insufficient documentation

## 2018-04-19 DIAGNOSIS — F1210 Cannabis abuse, uncomplicated: Secondary | ICD-10-CM | POA: Insufficient documentation

## 2018-04-19 DIAGNOSIS — F17210 Nicotine dependence, cigarettes, uncomplicated: Secondary | ICD-10-CM | POA: Diagnosis not present

## 2018-04-19 DIAGNOSIS — R519 Headache, unspecified: Secondary | ICD-10-CM

## 2018-04-19 DIAGNOSIS — F419 Anxiety disorder, unspecified: Secondary | ICD-10-CM | POA: Insufficient documentation

## 2018-04-19 DIAGNOSIS — F319 Bipolar disorder, unspecified: Secondary | ICD-10-CM | POA: Diagnosis not present

## 2018-04-19 DIAGNOSIS — J45909 Unspecified asthma, uncomplicated: Secondary | ICD-10-CM | POA: Insufficient documentation

## 2018-04-19 LAB — GLUCOSE, CAPILLARY: GLUCOSE-CAPILLARY: 106 mg/dL — AB (ref 70–99)

## 2018-04-19 MED ORDER — KETOROLAC TROMETHAMINE 30 MG/ML IJ SOLN
30.0000 mg | Freq: Once | INTRAMUSCULAR | Status: AC
  Administered 2018-04-19: 30 mg via INTRAMUSCULAR
  Filled 2018-04-19: qty 1

## 2018-04-19 NOTE — ED Notes (Signed)
Spoke with Dr. Mayford Knife in regards to patient presentation. No new orders at this time.

## 2018-04-19 NOTE — ED Notes (Signed)

## 2018-04-19 NOTE — ED Triage Notes (Signed)
Patient presents to ED via POV from home with c/o headache x 3 days. Patient reports blurry vision but states "I am suppose to wear glasses but I don't and this headache makes it worse". Patient also reports nausea and vomiting x 3 days as well.

## 2018-04-19 NOTE — ED Provider Notes (Signed)
[facility] Emergency Department Provider Note   ____________________________________________    I have reviewed the triage vital signs and the nursing notes.   HISTORY  Chief Complaint Headache     HPI Jerry Benton is a 28 y.o. male presents with complaints of headache x3 to 4 days, he describes as a frontal throbbing headache.  Has tried Tylenol and Motrin with little improvement.  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  No reported surgical treatment or coiling.  No fevers or chills.  No neck pain.  No neuro deficits.  No nausea or vomiting.  Past Medical History:  Diagnosis Date  . Anxiety   . Asthma   . Autism   . Bipolar 1 disorder (HCC)   . Brain damage    Front Lobe  . Developmental delay   . 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 PTSD (post-traumatic stress disorder)     Patient Active Problem List   Diagnosis Date Noted  . Bipolar 1 disorder (HCC) 08/15/2015  . Developmental delay 08/15/2015  . 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 Cannabis abuse 08/15/2015    Past Surgical History:  Procedure Laterality Date  . HERNIA REPAIR    . SHOULDER SURGERY      Prior to Admission medications   Medication Sig Start Date End Date Taking? Authorizing Provider  azithromycin (ZITHROMAX Z-PAK) 250 MG tablet Take 2 tablets (500 mg) on  Day 1,  followed by 1 tablet (250 mg) once daily on Days 2 through 5. 01/03/17   Emily Filbert, MD  dicyclomine (BENTYL) 20 MG tablet Take 1 tablet (20 mg total) by mouth 3 (three) times daily as needed for spasms. 01/04/17   Phineas Semen, MD  paliperidone (INVEGA) 9 MG 24 hr tablet Take 9 mg by mouth every morning.    [provider]  prochlorperazine (COMPAZINE) 10 MG tablet Take 1 tablet (10 mg total) by mouth every 6  (six) hours as needed for nausea or vomiting. 01/04/17   Phineas Semen, MD  traZODone (DESYREL) 100 MG tablet Take 100 mg by mouth at bedtime.    [provider]     Allergies Lamictal [lamotrigine] and Lithium  No family history on file.  Social History Social History   Tobacco Use  . Smoking status: Current Every Day Smoker    Packs/day: 1.00    Types: Cigarettes  . Smokeless tobacco: Never Used  Substance Use Topics  . Alcohol use: Yes    Comment: Occ  . Drug use: No    Review of Systems  Constitutional: No fever/chills Eyes: As above ENT: No sore throat. Cardiovascular: Denies chest pain. Respiratory: Denies shortness of breath. Gastrointestinal: No abdominal pain.  No nausea, no vomiting.   Genitourinary: Negative for dysuria. Musculoskeletal: Negative for back pain. Skin: Negative for rash. Neurological: Negative for weakness   ____________________________________________   PHYSICAL EXAM:  VITAL SIGNS: ED Triage Vitals  Enc Vitals Group     BP 04/19/18 1332 127/84     Pulse Rate 04/19/18 1332 100     Resp 04/19/18 1332 15     Temp 04/19/18 1332 (!) 97.5 F (36.4 C)     Temp Source 04/19/18 1332 Oral     SpO2 04/19/18 1332 99 %     Weight 04/19/18 1333  78.5 kg (173 lb 2 oz)     Height 04/19/18 1333 1.676 m (5\' 6" )     Head Circumference --      Peak Flow --      Pain Score 04/19/18 1408 7     Pain Loc --      Pain Edu? --      Excl. in GC? --     Constitutional: Alert and oriented. No acute distress.  Eyes: Conjunctivae are normal.  PERRLA, EOMI Head: Atraumatic.   Neck:  Painless ROM Cardiovascular: Normal rate, regular rhythm. Grossly normal heart sounds.  Good peripheral circulation. Respiratory: Normal respiratory effort.  No retractions. Lungs CTAB. Gastrointestinal: Soft and nontender. No distention.   MusculoskeletaWarm and well perfused Neurologic:  Normal speech and language. No gross focal neurologic deficits are  appreciated.  Skin:  Skin is warm, dry and intact. No rash noted. Psychiatric: Mood and affect are normal. Speech and behavior are normal.  ____________________________________________   LABS (all labs ordered are listed, but only abnormal results are displayed)  Labs Reviewed  GLUCOSE, CAPILLARY - Abnormal; Notable for the following components:      Result Value   Glucose-Capillary 106 (*)    All other components within normal limits  CBG MONITORING, ED   ____________________________________________  EKG  None ____________________________________________  RADIOLOGY  CT head ____________________________________________   PROCEDURES  Procedure(s) performed: No  Procedures   Critical Care performed: No ____________________________________________   INITIAL IMPRESSION / ASSESSMENT AND PLAN / ED COURSE  Pertinent labs & imaging results that were available during my care of the patient were reviewed by me and considered in my medical decision making (see chart for details).  Patient overall well-appearing, normal neuro exam.  Doubt the patient had a leaking aneurysm given no intervention but we will obtain CT head given unclear history.  CT is unremarkable, will treat with IM Toradol and reevaluate  ----------------------------------------- 3:28 PM on 04/19/2018 -----------------------------------------  Patient reports headache is improved, appropriate for discharge at this time    ____________________________________________   FINAL CLINICAL IMPRESSION(S) / ED DIAGNOSES  Final diagnoses:  Acute nonintractable headache, unspecified headache type        Note:  This document was prepared using Dragon voice recognition software and may include unintentional dictation errors.   Jene Every, MD 04/19/18 [phone]

## 2018-09-14 IMAGING — CR DG CHEST 2V
2 series · 2 of 2 positions shown · non-contrast
Comparison: None.

CLINICAL DATA: 26 y/o  M; fever and weakness.

EXAM:
CHEST  2 VIEW

[chest lat]
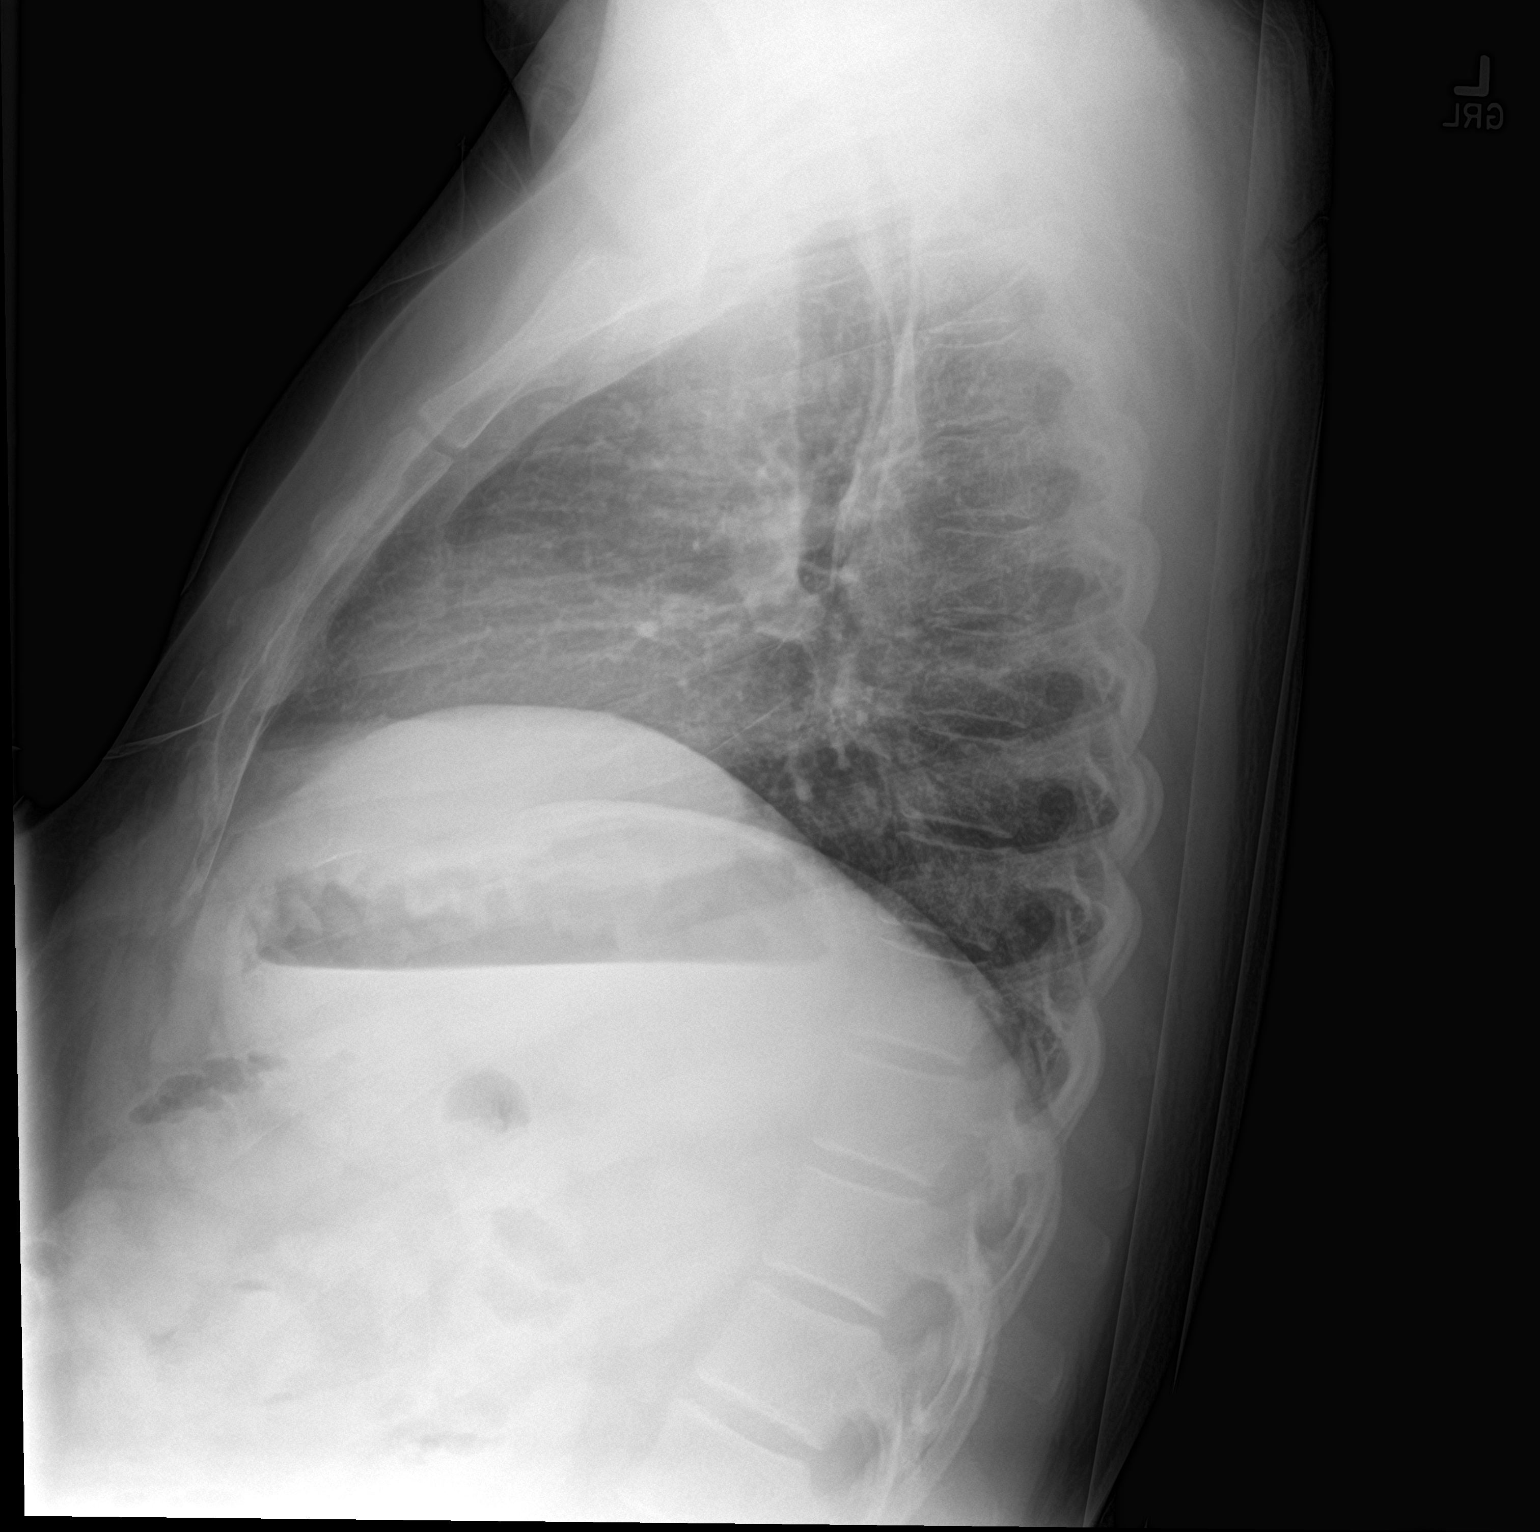

[chest ap]
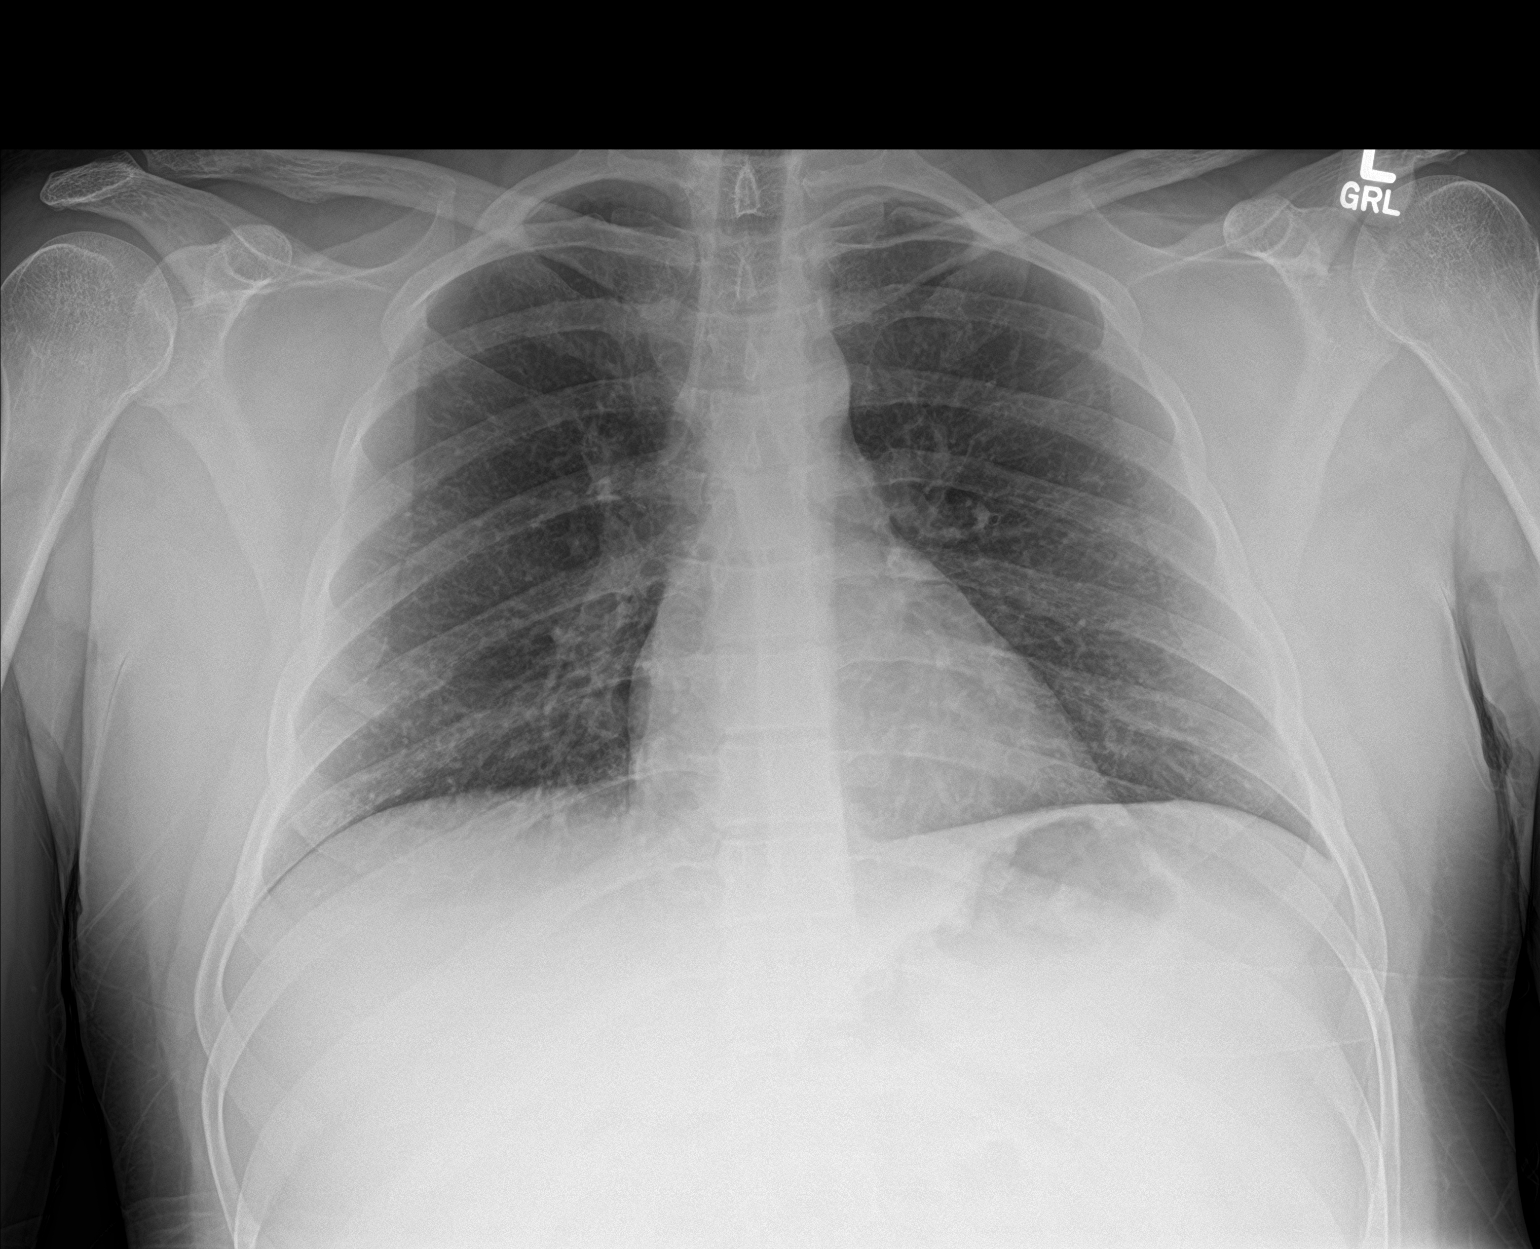

[2 of 2 positions shown; findings below may reference images not displayed]

FINDINGS: The heart size and mediastinal contours are within normal limits.
Both lungs are clear. The visualized skeletal structures are
unremarkable.
IMPRESSION: No active cardiopulmonary disease.

By: Ilia Moye M.D.

## 2018-09-14 IMAGING — CT CT ABD-PELV W/ CM
2 of 4 series · 15 of 46 positions shown, 17 images · IV contrast (APPLIED)
Comparison: None.

CLINICAL DATA: Patient began feeling sick yesterday. Abdominal pain
today with vomiting. Fevers and chills.

EXAM:
CT ABDOMEN AND PELVIS WITH CONTRAST
TECHNIQUE: Multidetector CT imaging of the abdomen and pelvis was performed
using the standard protocol following bolus administration of
intravenous contrast.
CONTRAST:  100mL LAOAJJ-RTT IOPAMIDOL (LAOAJJ-RTT) INJECTION 61%

[Series 2: routine abd/pel with · axial · 0.74mm/px · z∈[-542,-62]mm · 12 of 106 slices shown, 14 images]
[im 5/106  soft-tissue]
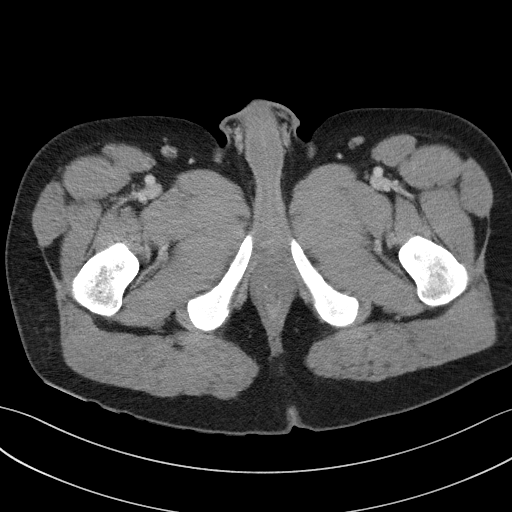
[im 5/106  bone]
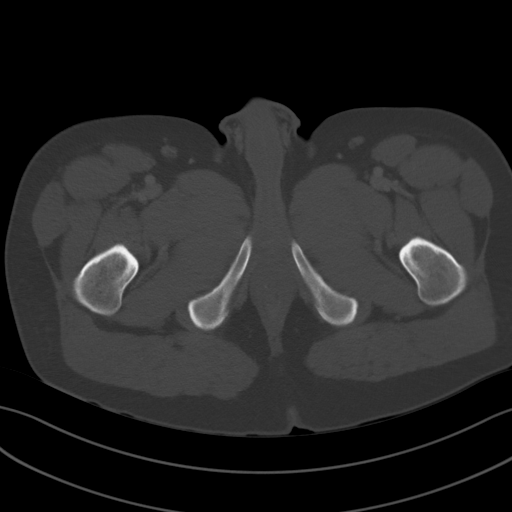
[im 14/106  soft-tissue]
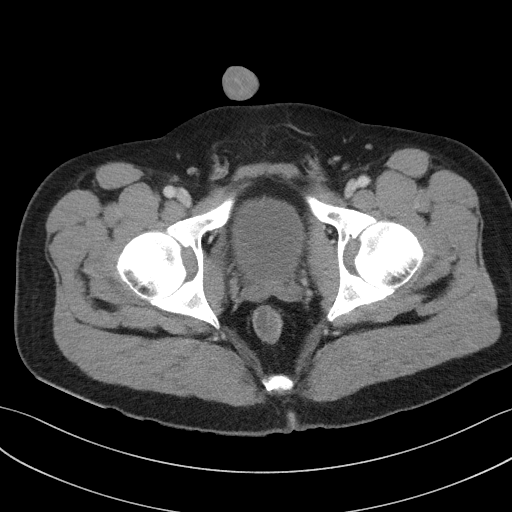
[im 22/106  soft-tissue]
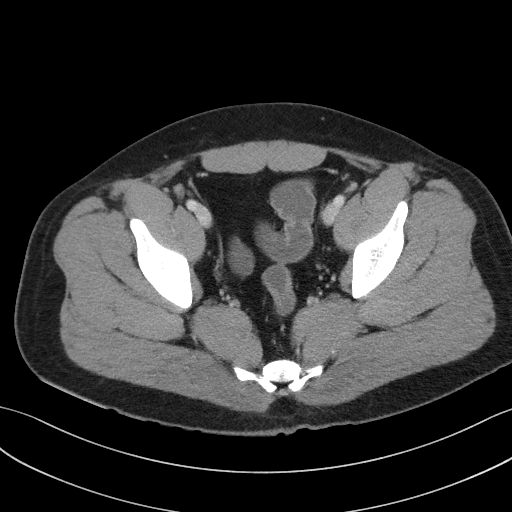
[im 31/106  soft-tissue]
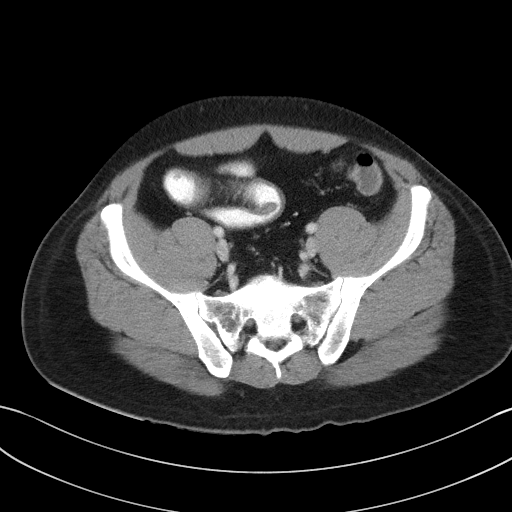
[im 40/106  soft-tissue]
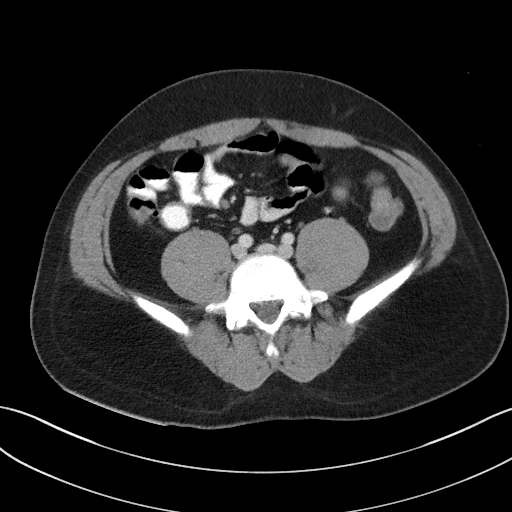
[im 49/106  soft-tissue]
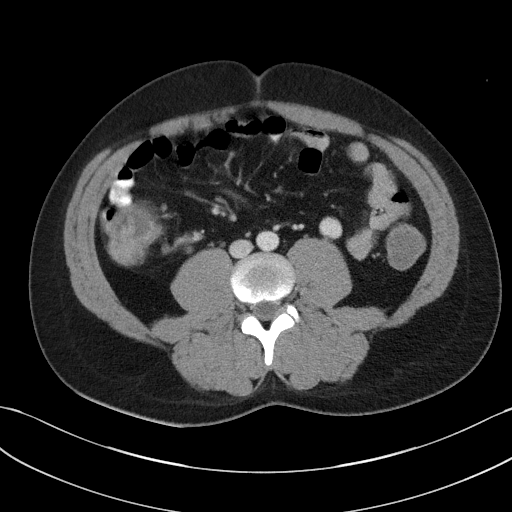
[im 57/106  soft-tissue]
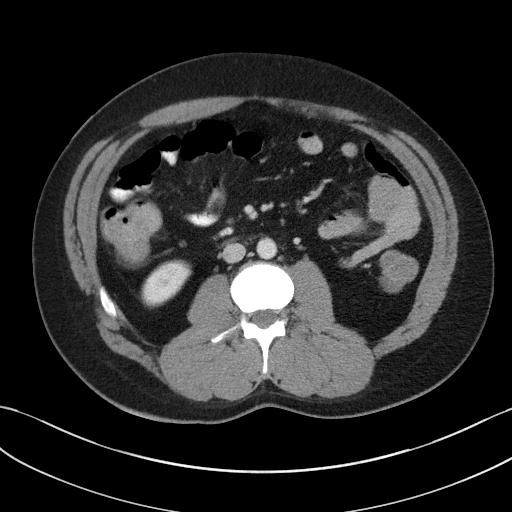
[im 66/106  soft-tissue]
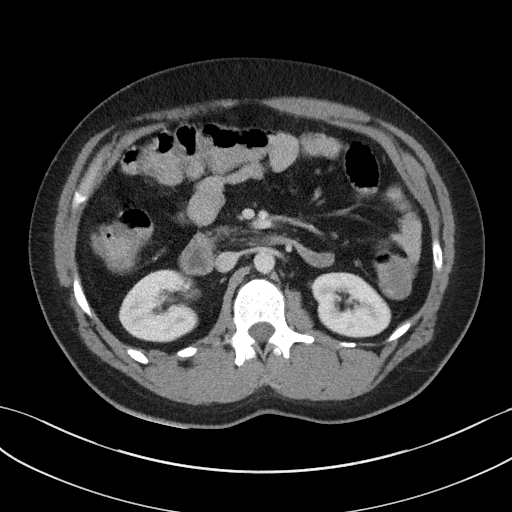
[im 75/106  soft-tissue]
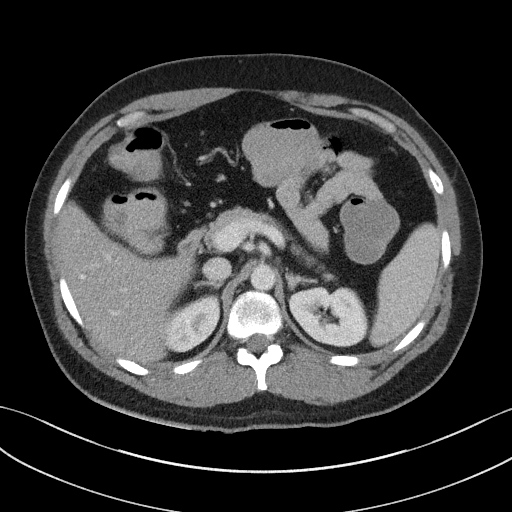
[im 75/106  bone]
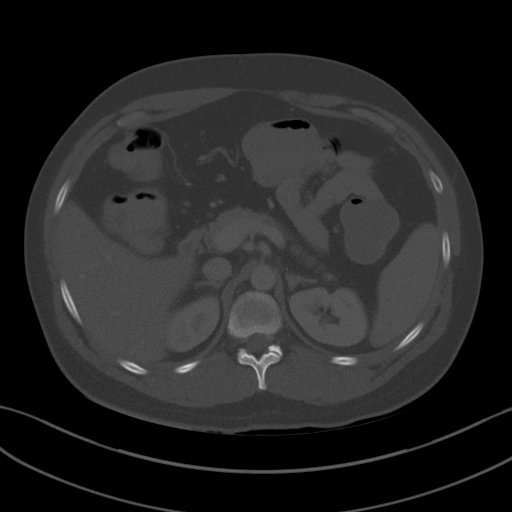
[im 84/106  soft-tissue]
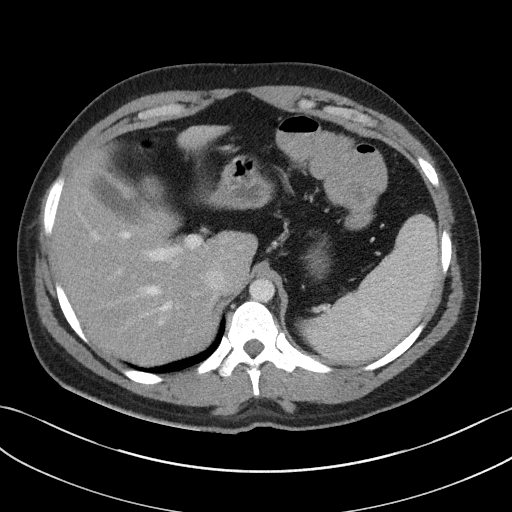
[im 92/106  soft-tissue]
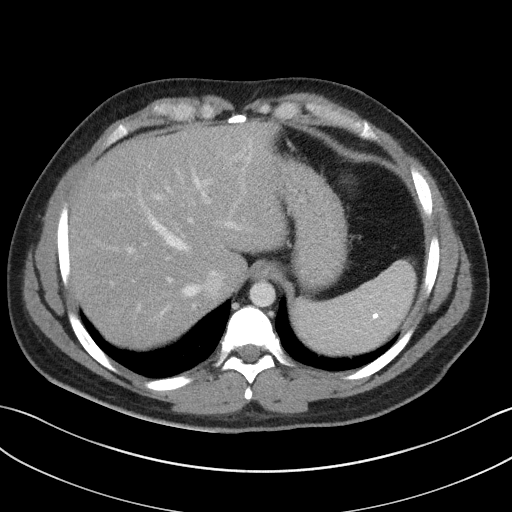
[im 101/106  soft-tissue]
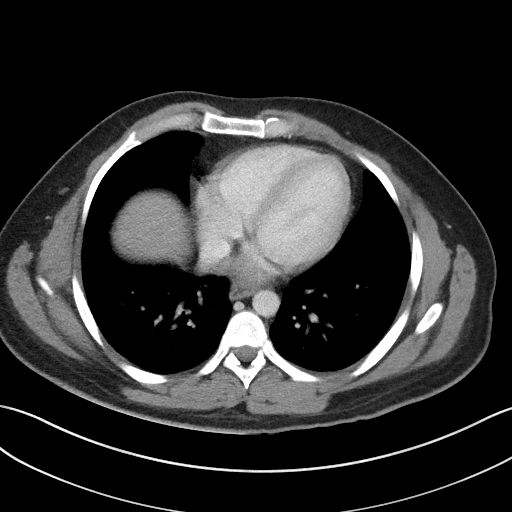

[Series 5: coronal st · coronal · 0.68mm/px · 3 of 92 slices shown]
[im 31/92  soft-tissue]
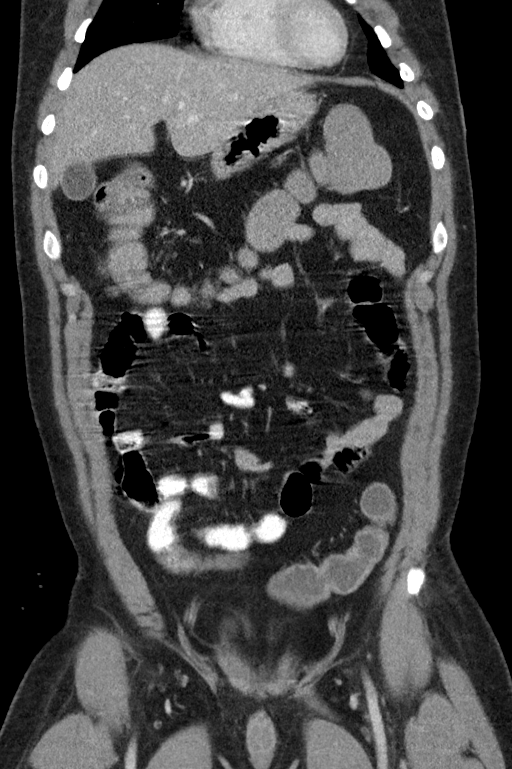
[im 41/92  soft-tissue]
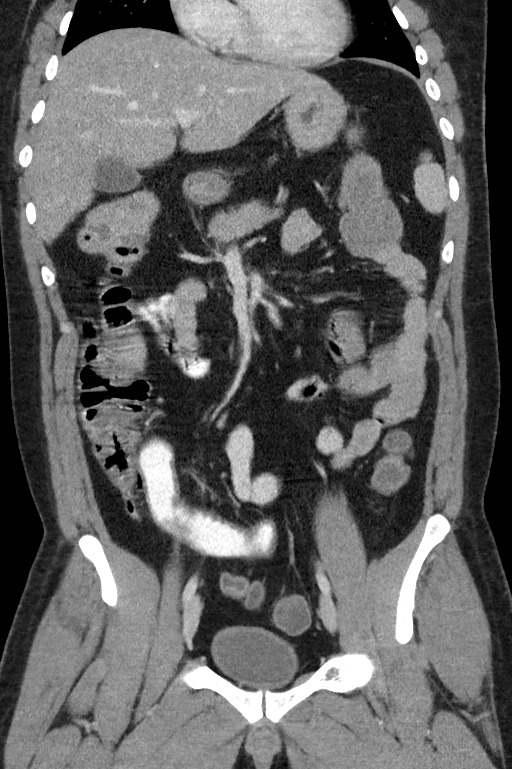
[im 51/92  soft-tissue]
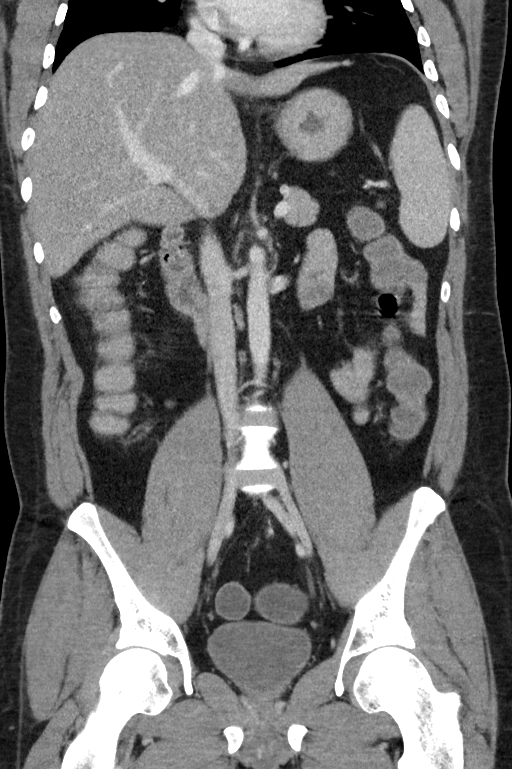

[15 of 46 positions shown; findings below may reference images not displayed]

FINDINGS: Lower chest: Mild opacity in the lung bases may be due to
atelectasis or motion artifact.

Hepatobiliary: Diffuse fatty infiltration of the liver. No focal
liver abnormality is seen. No gallstones, gallbladder wall
thickening, or biliary dilatation.

Pancreas: Unremarkable. No pancreatic ductal dilatation or
surrounding inflammatory changes.

Spleen: Calcified granulomas in the spleen. Spleen appears mildly
enlarged. No focal lesions.

Adrenals/Urinary Tract: Adrenal glands are unremarkable. Kidneys are
normal, without renal calculi, focal lesion, or hydronephrosis.
Bladder is unremarkable.

Stomach/Bowel: Stomach, small bowel, and colon are not abnormally
distended. There is evidence of wall thickening in the terminal
ileum without pneumatosis. Mild wall thickening also suggested in
the rectosigmoid colon. Changes may indicate entero colitis or
inflammatory bowel disease. There prominent lymph nodes in the right
lower quadrant which may be reactive or could indicate adenitis.
Appendix is normal.

Vascular/Lymphatic: No significant vascular findings are present. No
enlarged abdominal or pelvic lymph nodes.

Reproductive: Prostate is unremarkable.

Other: No free air or free fluid in the abdomen. Abdominal wall
musculature appears intact.

Musculoskeletal: No acute or significant osseous findings.
IMPRESSION: 1. Wall thickening of the terminal ileum and rectosigmoid colon.
Changes may indicate inter colitis or inflammatory bowel disease.
Prominent right lower quadrant lymph nodes are probably reactive.
2. Diffuse fatty infiltration of the liver.
3. Calcified granulomas in the spleen.  Mild splenic enlargement.

## 2019-02-09 ENCOUNTER — Encounter: Payer: Self-pay | Admitting: Emergency Medicine

## 2019-02-09 ENCOUNTER — Emergency Department
Admission: EM | Admit: 2019-02-09 | Discharge: 2019-02-09 | Disposition: A | Discharge status: Home / Self Care | Payer: Medicaid Other | Attending: Emergency Medicine | Admitting: Emergency Medicine

## 2019-02-09 DIAGNOSIS — Z5321 Procedure and treatment not carried out due to patient leaving prior to being seen by health care provider: Secondary | ICD-10-CM | POA: Diagnosis not present

## 2019-02-09 DIAGNOSIS — L299 Pruritus, unspecified: Secondary | ICD-10-CM | POA: Insufficient documentation

## 2019-02-09 NOTE — ED Triage Notes (Signed)
Pt arrived via EMS from [facility] due to itching after drinking alcoholic eggnog. Pt has no rash, redness, or swelling.

## 2019-02-09 NOTE — ED Notes (Signed)
Pt st leaving now due to long wait 

## 2019-05-15 ENCOUNTER — Encounter: Payer: Self-pay | Admitting: Emergency Medicine

## 2019-05-15 ENCOUNTER — Other Ambulatory Visit: Payer: Self-pay

## 2019-05-15 ENCOUNTER — Emergency Department
Admission: EM | Admit: 2019-05-15 | Discharge: 2019-05-16 | Disposition: A | Discharge status: Home / Self Care | Payer: Medicaid Other | Attending: Emergency Medicine | Admitting: Emergency Medicine

## 2019-05-15 DIAGNOSIS — Z79899 Other long term (current) drug therapy: Secondary | ICD-10-CM | POA: Insufficient documentation

## 2019-05-15 DIAGNOSIS — F319 Bipolar disorder, unspecified: Secondary | ICD-10-CM | POA: Insufficient documentation

## 2019-05-15 DIAGNOSIS — Z20822 Contact with and (suspected) exposure to covid-19: Secondary | ICD-10-CM | POA: Insufficient documentation

## 2019-05-15 DIAGNOSIS — F17210 Nicotine dependence, cigarettes, uncomplicated: Secondary | ICD-10-CM | POA: Insufficient documentation

## 2019-05-15 DIAGNOSIS — J45909 Unspecified asthma, uncomplicated: Secondary | ICD-10-CM | POA: Diagnosis not present

## 2019-05-15 DIAGNOSIS — G4700 Insomnia, unspecified: Secondary | ICD-10-CM

## 2019-05-15 DIAGNOSIS — Z87820 Personal history of traumatic brain injury: Secondary | ICD-10-CM | POA: Insufficient documentation

## 2019-05-15 LAB — ACETAMINOPHEN LEVEL: Acetaminophen (Tylenol), Serum: <10 ug/mL — ABNORMAL LOW (ref 10–30)

## 2019-05-15 LAB — COMPREHENSIVE METABOLIC PANEL
ALT: 28 U/L (ref 0–44)
AST: 32 U/L (ref 15–41)
Albumin: 4.8 g/dL (ref 3.5–5.0)
Alkaline Phosphatase: 89 U/L (ref 38–126)
Anion gap: 9 (ref 5–15)
BUN: 13 mg/dL (ref 6–20)
CO2: 27 mmol/L (ref 22–32)
Calcium: 9.8 mg/dL (ref 8.9–10.3)
Chloride: 102 mmol/L (ref 98–111)
Creatinine, Ser: 0.95 mg/dL (ref 0.61–1.24)
GFR calc Af Amer: >60 mL/min (ref >60)
GFR calc non Af Amer: >60 mL/min (ref >60)
Glucose, Bld: 85 mg/dL (ref 70–99)
Potassium: 4.5 mmol/L (ref 3.5–5.1)
Sodium: 138 mmol/L (ref 135–145)
Total Bilirubin: 1.3 mg/dL — ABNORMAL HIGH (ref 0.3–1.2)
Total Protein: 7.8 g/dL (ref 6.5–8.1)

## 2019-05-15 LAB — URINE DRUG SCREEN, QUALITATIVE (ARMC ONLY)
Amphetamines, Ur Screen: NOT DETECTED
Barbiturates, Ur Screen: NOT DETECTED
Benzodiazepine, Ur Scrn: NOT DETECTED
Cannabinoid 50 Ng, Ur ~~LOC~~: POSITIVE — AB
Cocaine Metabolite,Ur ~~LOC~~: NOT DETECTED
MDMA (Ecstasy)Ur Screen: NOT DETECTED
Methadone Scn, Ur: NOT DETECTED
Opiate, Ur Screen: NOT DETECTED
Phencyclidine (PCP) Ur S: NOT DETECTED
Tricyclic, Ur Screen: NOT DETECTED

## 2019-05-15 LAB — CBC
HCT: 46.7 % (ref 39.0–52.0)
Hemoglobin: 15.7 g/dL (ref 13.0–17.0)
MCH: 27.4 pg (ref 26.0–34.0)
MCHC: 33.6 g/dL (ref 30.0–36.0)
MCV: 81.6 fL (ref 80.0–100.0)
Platelets: 155 10*3/uL (ref 150–400)
RBC: 5.72 MIL/uL (ref 4.22–5.81)
RDW: 12.2 % (ref 11.5–15.5)
WBC: 9.3 10*3/uL (ref 4.0–10.5)
nRBC: 0 % (ref 0.0–0.2)

## 2019-05-15 LAB — ETHANOL: Alcohol, Ethyl (B): <10 mg/dL (ref <10)

## 2019-05-15 LAB — SALICYLATE LEVEL: Salicylate Lvl: <7 mg/dL — ABNORMAL LOW (ref 7.0–30.0)

## 2019-05-15 NOTE — ED Notes (Signed)
Pt dressed into hospital appropriate scrubs by this RN and Nicki [place], EDT.  Belongings placed into 1:1 belongings bag.  Belongings include:black shoes filas, 1 blue jeans, black belt, purple shirt, plaid underwear, Bradley gray hoodie.

## 2019-05-15 NOTE — ED Provider Notes (Signed)
[facility] Provider Note  ____________________________________________  Time seen: Approximately 10:17 PM  I have reviewed the triage vital signs and the nursing notes.   HISTORY  Chief Complaint Psychiatric Evaluation    HPI [name] is a 29 y.o. male with a history of autism, TBI, PTSD, bipolar disorder sent to the ED under IVC from [facility] for hospitalization and further psychiatric evaluation.  Reportedly patient has recently initiated Prozac and Ritalin, but no improvement in symptoms consisting primarily of insomnia and agitation.  No hallucinations SI or HI reported.  No acute medical complaints.      Past Medical History:  Diagnosis Date  . Anxiety   . Asthma   . Autism   . Bipolar 1 disorder (HCC)   . Brain damage    Front Lobe  . Developmental delay   . 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 PTSD (post-traumatic stress disorder)      Patient Active Problem List   Diagnosis Date Noted  . Bipolar 1 disorder (HCC) 08/15/2015  . Developmental delay 08/15/2015  . 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 Cannabis abuse 08/15/2015     Past Surgical History:  Procedure Laterality Date  . HERNIA REPAIR    . SHOULDER SURGERY       Prior to Admission medications   Medication Sig Start Date End Date Taking? Authorizing Provider  azithromycin (ZITHROMAX Z-PAK) 250 MG tablet Take 2 tablets (500 mg) on  Day 1,  followed by 1 tablet (250 mg) once daily on Days 2 through 5. 01/03/17   Emily Filbert, MD  dicyclomine (BENTYL) 20 MG tablet Take 1 tablet (20 mg total) by mouth 3 (three) times daily as needed for spasms. 01/04/17   Phineas Semen, MD  paliperidone (INVEGA) 9 MG 24 hr tablet Take 9 mg by mouth every morning.    [provider]  prochlorperazine (COMPAZINE) 10 MG tablet Take 1 tablet (10 mg total) by mouth every 6 (six) hours as needed  for nausea or vomiting. 01/04/17   Phineas Semen, MD  traZODone (DESYREL) 100 MG tablet Take 100 mg by mouth at bedtime.    [provider]     Allergies Lamictal [lamotrigine] and Lithium   History reviewed. No pertinent family history.  Social History Social History   Tobacco Use  . Smoking status: Current Every Day Smoker    Packs/day: 1.50    Types: Cigarettes  . Smokeless tobacco: Never Used  Substance Use Topics  . Alcohol use: Yes    Comment: Occ  . Drug use: Yes    Types: Marijuana    Review of Systems  Constitutional:   No fever or chills.  ENT:   No sore throat. No rhinorrhea. Cardiovascular:   No chest pain or syncope. Respiratory:   No dyspnea or cough. Gastrointestinal:   Negative for abdominal pain, vomiting and diarrhea.  Musculoskeletal:   Negative for focal pain or swelling All other systems reviewed and are negative except as documented above in ROS and HPI.  ____________________________________________   PHYSICAL EXAM:  VITAL SIGNS: ED Triage Vitals  Enc Vitals Group     BP 05/15/19 2002 112/82     Pulse Rate 05/15/19 2002 (!) 108     Resp 05/15/19 2002 16     Temp 05/15/19 2002 98 F (36.7 C)     Temp Source 05/15/19 2002 Oral     SpO2 05/15/19 2002 98 %  Weight 05/15/19 2006 150 lb (68 kg)     Height 05/15/19 2006 5\' 6"  (1.676 m)     Head Circumference --      Peak Flow --      Pain Score 05/15/19 2005 0     Pain Loc --      Pain Edu? --      Excl. in [place]? --     Vital signs reviewed, nursing assessments reviewed.   Constitutional:   Alert and oriented.  Sleeping but arousable.  Non-toxic appearance. Eyes:   Conjunctivae are normal.  ENT      Head:   Normocephalic and atraumatic.      Mouth/Throat:   Mask      Neck:   No meningismus. Full ROM. Hematological/Lymphatic/Immunilogical:   No cervical lymphadenopathy. Cardiovascular:   RRR. Symmetric bilateral radial and DP pulses.  No murmurs. Cap refill less than 2  seconds. Respiratory:   Normal respiratory effort without tachypnea/retractions. Breath sounds are clear and equal bilaterally. No wheezes/rales/rhonchi. Gastrointestinal:   Soft and nontender. Non distended. There is no CVA tenderness.  No rebound, rigidity, or guarding. Musculoskeletal:   Normal range of motion in all extremities.  No edema. Neurologic:   Normal speech and language.  Motor grossly intact. No acute focal neurologic deficits are appreciated.  Skin:    Skin is warm, dry and intact. No rash noted.  No wounds.  ____________________________________________    LABS (pertinent positives/negatives) (all labs ordered are listed, but only abnormal results are displayed) Labs Reviewed  COMPREHENSIVE METABOLIC PANEL - Abnormal; Notable for the following components:      Result Value   Total Bilirubin 1.3 (*)    All other components within normal limits  SALICYLATE LEVEL - Abnormal; Notable for the following components:   Salicylate Lvl <2.0 (*)    All other components within normal limits  ACETAMINOPHEN LEVEL - Abnormal; Notable for the following components:   Acetaminophen (Tylenol), Serum <10 (*)    All other components within normal limits  URINE DRUG SCREEN, QUALITATIVE ([facility] ONLY) - Abnormal; Notable for the following components:   Cannabinoid 50 Ng, [place] POSITIVE (*)    All other components within normal limits  ETHANOL  CBC   ____________________________________________   EKG  ____________________________________________    RADIOLOGY  No results found.  ____________________________________________   PROCEDURES Procedures  ____________________________________________  CLINICAL IMPRESSION / ASSESSMENT AND PLAN / ED COURSE  Pertinent labs & imaging results that were available during my care of the patient were reviewed by me and considered in my medical decision making (see chart for details).  [name] was evaluated in Emergency Department on  05/15/2019 for the symptoms described in the history of present illness. He was evaluated in the context of the global COVID-19 pandemic, which necessitated consideration that the patient might be at risk for infection with the SARS-CoV-2 virus that causes COVID-19. Institutional protocols and algorithms that pertain to the evaluation of patients at risk for COVID-19 are in a state of rapid change based on information released by regulatory bodies including the CDC and federal and state organizations. These policies and algorithms were followed during the patient's care in the ED.   Patient is medically stable, will consult psychiatry for further evaluation.  Continue IVC.      ____________________________________________   FINAL CLINICAL IMPRESSION(S) / ED DIAGNOSES    Final diagnoses:  Insomnia, unspecified type     ED Discharge Orders    None  Portions of this note were generated with dragon dictation software. Dictation errors may occur despite best attempts at proofreading.   Sharman Cheek, MD 05/15/19 2242

## 2019-05-15 NOTE — ED Triage Notes (Signed)
Pt to ED under IVC by [employer].  States had an argument with mother tonight who took out the IVC paperwork.  States started Prozac recently and has been more agitated recently, also states ritalin prescription has been increased.  States hasn't slept in three days.  Denies SI/HI at this time, denies A/V hallucinations.

## 2019-05-15 NOTE — ED Notes (Signed)
Patient could not be aroused to participate with the assessment.  TTS will attempt again at a later time.

## 2019-05-16 DIAGNOSIS — G4700 Insomnia, unspecified: Secondary | ICD-10-CM | POA: Insufficient documentation

## 2019-05-16 LAB — RESPIRATORY PANEL BY RT PCR (FLU A&B, COVID)
Influenza A by PCR: NEGATIVE
Influenza B by PCR: NEGATIVE
SARS Coronavirus 2 by RT PCR: NEGATIVE

## 2019-05-16 NOTE — ED Provider Notes (Signed)
-----------------------------------------   5:59 AM on 05/16/2019 -----------------------------------------   Blood pressure 112/82, pulse (!) 108, temperature 98 F (36.7 C), temperature source Oral, resp. rate 16, height 5\' 6"  (1.676 m), weight 68 kg, SpO2 98 %.  The patient is sleeping at this time.  There have been no acute events since the last update.  Awaiting disposition plan from Behavioral Medicine and/or Social Work team(s).   , MD 05/16/19 [phone]
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
## 2019-05-16 NOTE — Consult Note (Signed)
[facility] Face-to-Face Psychiatry Consult   Reason for Consult: Agitation Referring Physician: Larinda Buttery Patient Identification: Jerry Benton MRN:  [id number] Principal Diagnosis: <principal problem not specified> Diagnosis:  Active Problems:   * No active hospital problems. *   Total Time spent with patient: 30 minutes  Subjective:   Jerry Benton is a 29 y.o. male patient presented under IVC for complaints of aggression in the home.Marland Kitchen  HPI: Patient is a 29 year old male with a history of autism, frontal lobe deficit, PTSD, and intellectual disability who presents under IVC for complaints of aggression at home.  Per IVC documentation patient had been taking extra doses of Ritalin and not sleeping as well as showing increasing aggression.  Upon evaluation this morning, patient was calm and cooperative.  He denied any issues or psychiatric complaints.  Patient explained the situation as follows: He states that his fiance had moved in with him and they had run into some interpersonal difficulties with his mother.  He reports that his mother out of spite took an IVC out against him as she has done in the past.  Patient states that his mom and his fiance do not get along and sometimes this comes to ahead.  Patient is denying any substance abuse but does report recent increase in his Ritalin medication which she feels was also contributing somewhat to his insomnia.  He is agreeable with the plan to not take any more dosages until he can follow-up with his outpatient doctor and have his medication adjusted.  Patient at this time is not endorsing any psychiatric symptoms including mania psychosis paranoia suicide or homicidal ideation.  Past Psychiatric History: Patient has a history of TBI as well as multiple psychiatric diagnoses that he receives treatment for an outpatient basis  Risk to Self: Suicidal Ideation: No Suicidal Intent: No Is patient at risk for suicide?: No Suicidal Plan?: No Access to  Means: No What has been your use of drugs/alcohol within the last 12 months?: Use of marijuana How many times?: 0 Other Self Harm Risks: Denied Triggers for Past Attempts: None known Intentional Self Injurious Behavior: None Risk to Others: Homicidal Ideation: No Thoughts of Harm to Others: No Current Homicidal Intent: No Current Homicidal Plan: No Access to Homicidal Means: No Identified Victim: None identified History of harm to others?: No Assessment of Violence: None Noted Does patient have access to weapons?: No Does patient have a court date: No Prior Inpatient Therapy: Prior Inpatient Therapy: Yes Prior Therapy Dates: 2020 and prior Prior Therapy Facilty/Provider(s): [facility], [place], and others Reason for Treatment: Bipolar Disorder Prior Outpatient Therapy: Prior Outpatient Therapy: Yes Prior Therapy Dates: Current Prior Therapy [facility]ilty[facility] Reason for Treatment: Bipolar Disorder Does patient have an ACCT team?: No Does patient have Intensive In-House Services?  : No Does patient have [facility] services? : No Does patient have P4CC services?: No  Past Medical History:  Past Medical History:  Diagnosis Date  . Anxiety   . Asthma   . Autism   . Bipolar 1 disorder (HCC)   . Brain damage    Front Lobe  . Developmental delay   . 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 PTSD (post-traumatic stress disorder)     Past Surgical History:  Procedure Laterality Date  . HERNIA REPAIR    . SHOULDER SURGERY     Family History: History reviewed. No pertinent family history. Family Psychiatric  History: Unknown Social History:  Social History   Substance and Sexual  Activity  Alcohol Use Yes   Comment: Occ     Social History   Substance and Sexual Activity  Drug Use Yes  . Types: Marijuana    Social History   Socioeconomic History  . Marital status: Single    Spouse name: Not on file  . Number of children: Not on file   . Years of education: Not on file  . Highest education level: Not on file  Occupational History  . Not on file  Tobacco Use  . Smoking status: Current Every Day Smoker    Packs/day: 1.50    Types: Cigarettes  . Smokeless tobacco: Never Used  Substance and Sexual Activity  . Alcohol use: Yes    Comment: Occ  . Drug use: Yes    Types: Marijuana  . Sexual activity: Not on file  Other Topics Concern  . Not on file  Social History Narrative  . Not on file   Social Determinants of Health   Financial Resource Strain:   . Difficulty of Paying Living Expenses: Not on file  Food Insecurity:   . Worried About Charity fundraiser in the Last Year: Not on file  . Ran Out of Food in the Last Year: Not on file  Transportation Needs:   . Lack of Transportation (Medical): Not on file  . Lack of Transportation (Non-Medical): Not on file  Physical Activity:   . Days of Exercise per Week: Not on file  . Minutes of Exercise per Session: Not on file  Stress:   . Feeling of Stress : Not on file  Social Connections:   . Frequency of Communication with Friends and Family: Not on file  . Frequency of Social Gatherings with Friends and Family: Not on file  . Attends Religious Services: Not on file  . Active Member of Clubs or Organizations: Not on file  . Attends Archivist Meetings: Not on file  . Marital Status: Not on file   Additional Social History:    Allergies:   Allergies  Allergen Reactions  . Lamictal [Lamotrigine] Rash  . Lithium Other (See Comments)    Toxic levels    Labs:  Results for orders placed or performed during the hospital encounter of 05/15/19 (from the past 48 hour(s))  Comprehensive metabolic panel     Status: Abnormal   Collection Time: 05/15/19  8:13 PM  Result Value Ref Range   Sodium 138 135 - 145 mmol/L   Potassium 4.5 3.5 - 5.1 mmol/L   Chloride 102 98 - 111 mmol/L   CO2 27 22 - 32 mmol/L   Glucose, Bld 85 70 - 99 mg/dL   BUN 13 6 - 20  mg/dL   Creatinine, Ser 0.95 0.61 - 1.24 mg/dL   Calcium 9.8 8.9 - 10.3 mg/dL   Total Protein 7.8 6.5 - 8.1 g/dL   Albumin 4.8 3.5 - 5.0 g/dL   AST 32 15 - 41 U/L   ALT 28 0 - 44 U/L   Alkaline Phosphatase 89 38 - 126 U/L   Total Bilirubin 1.3 (H) 0.3 - 1.2 mg/dL   GFR calc non Af Amer >60 >60 mL/min   GFR calc Af Amer >60 >60 mL/min   Anion gap 9 5 - 15    Comment: Performed at [facility], [address] [place], [place] [address]  Ethanol     Status: None   Collection Time: 05/15/19  8:13 PM  Result Value Ref Range   Alcohol,  Ethyl (B) <10 <10 mg/dL    Comment: (NOTE) Lowest detectable limit for serum alcohol is 10 mg/dL. For medical purposes only. Performed at [facility], [address] [place], [place] [address]   Salicylate level     Status: Abnormal   Collection Time: 05/15/19  8:13 PM  Result Value Ref Range   Salicylate Lvl <7.0 (L) 7.0 - 30.0 mg/dL    Comment: Performed at [facility], [address] [place], [place] [address]  Acetaminophen level     Status: Abnormal   Collection Time: 05/15/19  8:13 PM  Result Value Ref Range   Acetaminophen (Tylenol), Serum <10 (L) 10 - 30 ug/mL    Comment: (NOTE) Therapeutic concentrations vary significantly. A range of 10-30 ug/mL  may be an effective concentration for many patients. However, some  are best treated at concentrations outside of this range. Acetaminophen concentrations >150 ug/mL at 4 hours after ingestion  and >50 ug/mL at 12 hours after ingestion are often associated with  toxic reactions. Performed at [facility], [address] [place], [place] [address]   cbc     Status: None   Collection Time: 05/15/19  8:13 PM  Result Value Ref Range   WBC 9.3 4.0 - 10.5 K/uL   RBC 5.72 4.22 - 5.81 MIL/uL   Hemoglobin 15.7 13.0 - 17.0 g/dL   HCT 72.0 94.7 - 09.6 %   MCV 81.6 80.0 - 100.0 fL   MCH 27.4 26.0 - 34.0 pg   MCHC 33.6 30.0 - 36.0 g/dL   RDW 28.3 66.2 -  94.7 %   Platelets 155 150 - 400 K/uL   nRBC 0.0 0.0 - 0.2 %    Comment: Performed at [facility], [address] [place], [place] [address]  Urine Drug Screen, Qualitative     Status: Abnormal   Collection Time: 05/15/19  8:13 PM  Result Value Ref Range   Tricyclic, Ur Screen NONE DETECTED NONE DETECTED   Amphetamines, Ur Screen NONE DETECTED NONE DETECTED   MDMA (Ecstasy)Ur Screen NONE DETECTED NONE DETECTED   Cocaine Metabolite,Ur [place] NONE DETECTED NONE DETECTED   Opiate, Ur Screen NONE DETECTED NONE DETECTED   Phencyclidine (PCP) Ur S NONE DETECTED NONE DETECTED   Cannabinoid 50 Ng, Ur [place] POSITIVE (A) NONE DETECTED   Barbiturates, Ur Screen NONE DETECTED NONE DETECTED   Benzodiazepine, Ur Scrn NONE DETECTED NONE DETECTED   Methadone Scn, Ur NONE DETECTED NONE DETECTED    Comment: (NOTE) Tricyclics + metabolites, urine    Cutoff 1000 ng/mL Amphetamines + metabolites, urine  Cutoff 1000 ng/mL MDMA (Ecstasy), urine              Cutoff 500 ng/mL Cocaine Metabolite, urine          Cutoff 300 ng/mL Opiate + metabolites, urine        Cutoff 300 ng/mL Phencyclidine (PCP), urine         Cutoff 25 ng/mL Cannabinoid, urine                 Cutoff 50 ng/mL Barbiturates + metabolites, urine  Cutoff 200 ng/mL Benzodiazepine, urine              Cutoff 200 ng/mL Methadone, urine                   Cutoff 300 ng/mL The urine drug screen provides only a preliminary, unconfirmed analytical test result and should not be used for non-medical purposes.  Clinical consideration and professional judgment should be applied to any positive drug screen result due to possible interfering substances. A more specific alternate chemical method must be used in order to obtain a confirmed analytical result. Gas chromatography / mass spectrometry (GC/MS) is the preferred confirmat ory method. Performed at [facility], [address] [place], [place] [address]   Respiratory Panel by RT PCR  (Flu A&B, Covid) - Nasopharyngeal Swab     Status: None   Collection Time: 05/16/19 12:17 AM   Specimen: Nasopharyngeal Swab  Result Value Ref Range   SARS Coronavirus 2 by RT PCR NEGATIVE NEGATIVE    Comment: (NOTE) SARS-CoV-2 target nucleic acids are NOT DETECTED. The SARS-CoV-2 RNA is generally detectable in upper respiratoy specimens during the acute phase of infection. The lowest concentration of SARS-CoV-2 viral copies this assay can detect is 131 copies/mL. A negative result does not preclude SARS-Cov-2 infection and should not be used as the sole basis for treatment or other patient management decisions. A negative result may occur with  improper specimen collection/handling, submission of specimen other than nasopharyngeal swab, presence of viral mutation(s) within the areas targeted by this assay, and inadequate number of viral copies (<131 copies/mL). A negative result must be combined with clinical observations, patient history, and epidemiological information. The expected result is Negative. Fact Sheet for Patients:  https://www.moore.com/ Fact Sheet for Healthcare Providers:  https://www.young.biz/ This test is not yet ap proved or cleared by the [place] FDA and  has been authorized for detection and/or diagnosis of SARS-CoV-2 by FDA under an Emergency Use Authorization (EUA). This EUA will remain  in effect (meaning this test can be used) for the duration of the COVID-19 declaration under Section 564(b)(1) of the Act, 21 U.S.C. section 360bbb-3(b)(1), unless the authorization is terminated or revoked sooner.    Influenza A by PCR NEGATIVE NEGATIVE   Influenza B by PCR NEGATIVE NEGATIVE    Comment: (NOTE) The Xpert Xpress SARS-CoV-2/FLU/RSV assay is intended as an aid in  the diagnosis of influenza from Nasopharyngeal swab specimens and  should not be used as a sole basis for treatment. Nasal washings and  aspirates are  unacceptable for Xpert Xpress SARS-CoV-2/FLU/RSV  testing. Fact Sheet for Patients: https://www.moore.com/ Fact Sheet for Healthcare Providers: https://www.young.biz/ This test is not yet approved or cleared by the [place] FDA and  has been authorized for detection and/or diagnosis of SARS-CoV-2 by  FDA under an Emergency Use Authorization (EUA). This EUA will remain  in effect (meaning this test can be used) for the duration of the  Covid-19 declaration under Section 564(b)(1) of the Act, 21  U.S.C. section 360bbb-3(b)(1), unless the authorization is  terminated or revoked. Performed at [facility], [address] [place], [place] [address]     No current facility-administered medications for this encounter.   Current Outpatient Medications  Medication Sig Dispense Refill  . azithromycin (ZITHROMAX Z-PAK) 250 MG tablet Take 2 tablets (500 mg) on  Day 1,  followed by 1 tablet (250 mg) once daily on Days 2 through 5. 6 each 0  . dicyclomine (BENTYL) 20 MG tablet Take 1 tablet (20 mg total) by mouth 3 (three) times daily as needed for spasms. 20 tablet 0  . paliperidone (INVEGA) 9 MG 24 hr tablet Take 9 mg by mouth every morning.    . prochlorperazine (COMPAZINE) 10 MG tablet Take 1 tablet (10 mg total) by mouth every 6 (six) hours as needed for nausea or vomiting.  30 tablet 0  . traZODone (DESYREL) 100 MG tablet Take 100 mg by mouth at bedtime.      Musculoskeletal: Strength & Muscle Tone: within normal limits Gait & Station: normal Patient leans: N/A  Psychiatric Specialty Exam: Physical Exam  Review of Systems  Blood pressure 112/82, pulse (!) 108, temperature 98 F (36.7 C), temperature source Oral, resp. rate 16, height 5\' 6"  (1.676 m), weight 68 kg, SpO2 98 %.Body mass index is 24.21 kg/m.  General Appearance: Casual  Eye Contact:  Fair  Speech:  Clear and Coherent  Volume:  Normal  Mood:  Euthymic  Affect:   Appropriate  Thought Process:  Coherent  Orientation:  Full (Time, Place, and Person)  Thought Content:  Logical  Suicidal Thoughts:  No  Homicidal Thoughts:  No  Memory:  Recent;   Fair  Judgement:  Intact  Insight:  Fair  Psychomotor Activity:  Normal  Concentration:  Concentration: Fair  Recall:  [employer] of Knowledge:  Fair  Language:  Fair  Akathisia:  No  Handed:  Right  AIMS (if indicated):     Assets:  Communication Skills Desire for Improvement Housing Intimacy Leisure Time Physical Health Resilience Social Support  ADL's:  Intact  Cognition:  WNL  Sleep:        Treatment Plan Summary: 29 year old male with history of multiple psychiatric disorders including PTSD intellectual disability who presents following an episode of aggression in the home.  Upon interview patient is calm and cooperative and denying any psychiatric symptoms.  Although this patient carries a diagnosis of intellectual disability he is competent to make his decisions at this time and is not have a formal guardian.  He will be discharged under his own care as well as the care of his fiance.  Patient does not meet criteria for inpatient hospitalization  Diagnosis: Adjustment disorder  Disposition: No evidence of imminent risk to self or others at present.   Patient does not meet criteria for psychiatric inpatient admission. Supportive therapy provided about ongoing stressors. Discussed crisis plan, support from social network, calling 911, coming to the Emergency Department, and calling Suicide Hotline.  Clement Sayres, MD 05/16/2019 12:10 PM

## 2019-05-16 NOTE — ED Notes (Signed)
Assessment completed  No am meds ordered for him at this time  Encouraged him to give his mother a call with an update because she called him this am and he was sleeping   He denies pain

## 2019-05-16 NOTE — Progress Notes (Signed)
Jerry Benton is a 29 y.o. male with a history of autism, TBI, PTSD, bipolar disorder sent to the ED under IVC from [facility] for hospitalization and further psychiatric evaluation. The patient could not be aroused to participate with the assessment.

## 2019-08-08 ENCOUNTER — Emergency Department
Admission: EM | Admit: 2019-08-08 | Discharge: 2019-08-09 | Disposition: A | Discharge status: Home / Self Care | Payer: Medicaid Other | Attending: Student | Admitting: Student

## 2019-08-08 ENCOUNTER — Encounter: Payer: Self-pay | Admitting: Emergency Medicine

## 2019-08-08 ENCOUNTER — Other Ambulatory Visit: Payer: Self-pay

## 2019-08-08 DIAGNOSIS — F840 Autistic disorder: Secondary | ICD-10-CM | POA: Diagnosis not present

## 2019-08-08 DIAGNOSIS — F308 Other manic episodes: Secondary | ICD-10-CM | POA: Diagnosis not present

## 2019-08-08 DIAGNOSIS — Z20822 Contact with and (suspected) exposure to covid-19: Secondary | ICD-10-CM | POA: Insufficient documentation

## 2019-08-08 DIAGNOSIS — R6250 Unspecified lack of expected normal physiological development in childhood: Secondary | ICD-10-CM | POA: Diagnosis present

## 2019-08-08 DIAGNOSIS — R45851 Suicidal ideations: Secondary | ICD-10-CM | POA: Insufficient documentation

## 2019-08-08 DIAGNOSIS — F4325 Adjustment disorder with mixed disturbance of emotions and conduct: Secondary | ICD-10-CM | POA: Diagnosis not present

## 2019-08-08 DIAGNOSIS — F319 Bipolar disorder, unspecified: Secondary | ICD-10-CM | POA: Diagnosis present

## 2019-08-08 DIAGNOSIS — G4700 Insomnia, unspecified: Secondary | ICD-10-CM | POA: Diagnosis not present

## 2019-08-08 DIAGNOSIS — F1210 Cannabis abuse, uncomplicated: Secondary | ICD-10-CM | POA: Diagnosis not present

## 2019-08-08 DIAGNOSIS — R4689 Other symptoms and signs involving appearance and behavior: Secondary | ICD-10-CM

## 2019-08-08 DIAGNOSIS — R451 Restlessness and agitation: Secondary | ICD-10-CM | POA: Diagnosis present

## 2019-08-08 LAB — CBC
HCT: 43.4 % (ref 39.0–52.0)
Hemoglobin: 15 g/dL (ref 13.0–17.0)
MCH: 27.6 pg (ref 26.0–34.0)
MCHC: 34.6 g/dL (ref 30.0–36.0)
MCV: 79.9 fL — ABNORMAL LOW (ref 80.0–100.0)
Platelets: 138 10*3/uL — ABNORMAL LOW (ref 150–400)
RBC: 5.43 MIL/uL (ref 4.22–5.81)
RDW: 12.3 % (ref 11.5–15.5)
WBC: 9.5 10*3/uL (ref 4.0–10.5)
nRBC: 0 % (ref 0.0–0.2)

## 2019-08-08 LAB — COMPREHENSIVE METABOLIC PANEL
ALT: 30 U/L (ref 0–44)
AST: 33 U/L (ref 15–41)
Albumin: 4.7 g/dL (ref 3.5–5.0)
Alkaline Phosphatase: 86 U/L (ref 38–126)
Anion gap: 6 (ref 5–15)
BUN: 11 mg/dL (ref 6–20)
CO2: 30 mmol/L (ref 22–32)
Calcium: 9.6 mg/dL (ref 8.9–10.3)
Chloride: 103 mmol/L (ref 98–111)
Creatinine, Ser: 0.94 mg/dL (ref 0.61–1.24)
GFR calc Af Amer: >60 mL/min (ref >60)
GFR calc non Af Amer: >60 mL/min (ref >60)
Glucose, Bld: 112 mg/dL — ABNORMAL HIGH (ref 70–99)
Potassium: 4.3 mmol/L (ref 3.5–5.1)
Sodium: 139 mmol/L (ref 135–145)
Total Bilirubin: 0.7 mg/dL (ref 0.3–1.2)
Total Protein: 7.9 g/dL (ref 6.5–8.1)

## 2019-08-08 LAB — URINE DRUG SCREEN, QUALITATIVE (ARMC ONLY)
Amphetamines, Ur Screen: NOT DETECTED
Barbiturates, Ur Screen: NOT DETECTED
Benzodiazepine, Ur Scrn: NOT DETECTED
Cannabinoid 50 Ng, Ur ~~LOC~~: POSITIVE — AB
Cocaine Metabolite,Ur ~~LOC~~: NOT DETECTED
MDMA (Ecstasy)Ur Screen: NOT DETECTED
Methadone Scn, Ur: NOT DETECTED
Opiate, Ur Screen: NOT DETECTED
Phencyclidine (PCP) Ur S: NOT DETECTED
Tricyclic, Ur Screen: NOT DETECTED

## 2019-08-08 LAB — ETHANOL: Alcohol, Ethyl (B): <10 mg/dL (ref <10)

## 2019-08-08 LAB — SARS CORONAVIRUS 2 BY RT PCR (HOSPITAL ORDER, PERFORMED IN ~~LOC~~ HOSPITAL LAB): SARS Coronavirus 2: NEGATIVE

## 2019-08-08 LAB — ACETAMINOPHEN LEVEL: Acetaminophen (Tylenol), Serum: <10 ug/mL — ABNORMAL LOW (ref 10–30)

## 2019-08-08 LAB — SALICYLATE LEVEL: Salicylate Lvl: <7 mg/dL — ABNORMAL LOW (ref 7.0–30.0)

## 2019-08-08 MED ORDER — TRAZODONE HCL 100 MG PO TABS
200.0000 mg | ORAL_TABLET | Freq: Every evening | ORAL | Status: DC | PRN
  Administered 2019-08-08: 200 mg via ORAL
  Filled 2019-08-08: qty 2

## 2019-08-08 MED ORDER — BENZTROPINE MESYLATE 1 MG PO TABS
2.0000 mg | ORAL_TABLET | Freq: Every day | ORAL | Status: DC
  Administered 2019-08-08: 2 mg via ORAL
  Filled 2019-08-08: qty 2

## 2019-08-08 MED ORDER — LORAZEPAM 1 MG PO TABS
1.0000 mg | ORAL_TABLET | Freq: Once | ORAL | Status: AC
  Administered 2019-08-08: 1 mg via ORAL
  Filled 2019-08-08: qty 1

## 2019-08-08 MED ORDER — PALIPERIDONE ER 3 MG PO TB24
6.0000 mg | ORAL_TABLET | ORAL | Status: DC
  Administered 2019-08-09: 6 mg via ORAL
  Filled 2019-08-08: qty 2

## 2019-08-08 MED ORDER — BUPROPION HCL ER (XL) 150 MG PO TB24
150.0000 mg | ORAL_TABLET | Freq: Every day | ORAL | Status: DC
  Administered 2019-08-08 – 2019-08-09 (×2): 150 mg via ORAL
  Filled 2019-08-08 (×2): qty 1

## 2019-08-08 NOTE — ED Notes (Signed)
PT  MOVED  TO  [facility]  UNIT / IVC  PENDING  CONSULT

## 2019-08-08 NOTE — ED Notes (Signed)
Patient talking loud and cursing in the hall. Patient moved to [facility]. Report to receiving nurse.

## 2019-08-08 NOTE — ED Notes (Signed)
Pt given chocolate milk per request

## 2019-08-08 NOTE — ED Triage Notes (Signed)
Pt reports his mom has been taking his meds and giving them to his sister who is on the same ones.

## 2019-08-08 NOTE — ED Notes (Signed)
He has talked on the phone with his mother - he has talked on the phone with his girlfriend  Pt states  "my girlfriend has found [place] somewhere we can live - I am so excited"
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
## 2019-08-08 NOTE — ED Notes (Signed)
Pt given lunch tray and a second cranberry juice.
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
## 2019-08-08 NOTE — ED Notes (Signed)
IVC PENDING  CONSULT ?
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
## 2019-08-08 NOTE — ED Notes (Signed)
Pt transferred into ED BHU room 3  Patient assigned to appropriate care area. Patient oriented to unit/care area: Informed that, for his safety, care areas are designed for safety and monitored by security cameras at all times; Visiting hours and phone times explained to patient. Patient verbalizes understanding, and verbal contract for safety obtained.   Assessment completed  He denies pain   

## 2019-08-09 NOTE — ED Provider Notes (Signed)
5:58 PM Patient has been seen and evaluated by psychiatry team and deemed appropriate for discharge. Patient determined not to be a threat to themselves or others. IVC rescinded by psychiatry team. Patient is otherwise medically clear and stable for discharge.     Miguel Aschoff., MD 08/09/19 [phone]

## 2019-08-09 NOTE — ED Notes (Signed)
Pt given breakfast tray and a cup of sprite.  

## 2019-08-09 NOTE — ED Notes (Signed)
IVC/  PENDING  PLACEMENT 

## 2019-08-09 NOTE — Final Consult Note (Signed)
Jerry Candise Bowens MD-- Psychiatry Note [place] [facility] Brief Note 08/09/19  Asked to follow- up with this 29 year old caucasian male on IVC seen by NP yesterday  Patient has a history of bipolar disorder mixed, generalized anxiety , Marijuana dependence, GF discord and parent child problems.   He is followed an outpatient and is receiving Xanax, Wellbutrin and Ritalin.  He was arguing with his mother in the car and was upset with GF who is now pregnant after recently having another of his kids.  Patient walked out of car and mom was anxious and interpreted that as he was going into traffic (written on IVC) He received no regular meds here, and calmed down during the 24 hours  He now wants to go home and is in stable condition.,   His mom as of today agreed to have him come home and will pick him up   He denies new symptoms and medical problems currently    MSE   Alert cooperative oriented to person place date and time.   Consciousness not clouded or fluctuant   Rapport fair --he is anxious hyperactive interrupts others and needs social skills ---  Eye contact normal   Appearance somewhat unkept   Speech --slightly pressured, speeded but this is his baseline.   Mood ---anxious slightly elevated but at basel;ine no severe bipolar traits   Affect the same ----  Movements no tics and movements  Thought content ---preoccupied with discord  But no delusions, illusions paranoia or frank fear issues  No hallucinations auditory and visual   Thought process ---no LOA FOI or related symptoms   Not illogical but not the best with his decision making   Concentration and attention poor from ADHD combined   Judgement insight ---fair to poor  needs improvement with coaching and social skills coping skills vocational training   Reliability --fair     SI and HI --no active plans contracts for safety wants to go home   Fund of knowledge intelligence average to poor   Memory  --remote recent and immediate intact through general questions  Abstraction and proverbs okay   No other new labs --noted   Diagnosis   Bipolar Disorder Mixed  Generalized anxiety  Parent child discord  GF discord ADHD combined   At this time patient can go home he is not actively suicidal as situation has subsided  No other new meds given   IVC has been rescinded and signed   ER MD has been notified

## 2019-08-09 NOTE — Consult Note (Signed)
[facility] Face-to-Face Psychiatry Consult   Reason for Consult: IVC Referring Physician: Dr. Erma Heritage Patient Identification: Jerry Benton MRN:  [id number] Principal Diagnosis: <principal problem not specified> Diagnosis:  Active Problems:   Bipolar 1 disorder (HCC)   Developmental delay   Adjustment disorder with mixed disturbance of emotions and conduct   Cannabis abuse   Insomnia   Total Time spent with patient: 30 minutes  Subjective: " My mom and my sister have been sending my girlfriend Jerry Benton is a 29 y.o. male patient presented to [facility] ED via law enforcement under involuntary commitment status (IVC). The patient was brought in due to his mom voicing he has been off his medication and was jumping out of moving cars.  The patient reports his mom has been taking his medications and giving them to his sister, who is on the same.  The patient voice his mother called the police because he was upset about his mother disrespecting his girlfriend. The patient was seen face-to-face by this provider; chart reviewed and consulted with Dr. Bradd Canary on 08/08/2019 due to the patient's care. It was discussed with the EDP that the patient would remain under observation overnight and reassess in the a.m. to determine if he meets the criteria for psychiatric inpatient admission or he could be discharged home. On evaluation, the patient is alert and oriented x 3, calm, sleepy, but cooperative, and mood-congruent with affect.  The patient does not appear to be responding to internal or external stimuli. Neither is the patient presenting with any delusional thinking. The patient denies auditory or visual hallucinations. The patient denies any suicidal, homicidal, or self-harm ideations. The patient is not presenting with any psychotic or paranoid behaviors. During an encounter with the patient, he was able to answer some questions appropriately.  Plan: The patient would remain under observation overnight and  reassess in the a.m. to determine if he meets the criteria for psychiatric inpatient admission or he could be discharged home.  Restarted on most of his home medications.  HPI: Per Dr. Erma Heritage: Jerry Benton is a 29 y.o. male with past medical history as below including developmental delay, history of traumatic brain injury, bipolar disorder, here with what he states is anxiety due to not having his medications.  He is adamant that his mother has been stealing his medications and giving them to his sister.  He states that he has not had his Xanax or Adderall this month.  He has subsequently developed increasing anxiety, difficulty focusing, as well as agitation.  He states that he has had some intermittent Klonopin from his mother, but no Xanax, which she is requesting for.  He denies any other complaints.  He has been smoking weed to help with his anxiety.  Denies any other drug use.  Per IVC petition, the patient has stopped taking his medications and has been violent and threatened to hurt his family today.  Patient has history of similar episodes.  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) PTSD (post-traumatic stress disorder)  Risk to Self: Suicidal Ideation: No Suicidal Intent: No Is patient at risk for suicide?: No Suicidal Plan?: No Access to Means: No What has been your use of drugs/alcohol within the last 12 months?: Cannabis & Alcohol How many times?: (Unable to remember) Other Self Harm Risks: Reports of none Triggers for Past Attempts: None known Intentional  Self Injurious Behavior: None Risk to Others: Homicidal Ideation: No Thoughts of Harm to Others: No Current Homicidal Intent: No Current Homicidal Plan: No Access to Homicidal Means: No Identified Victim: Reports of none History of harm to others?: No Assessment of Violence: None  Noted Violent Behavior Description: Reports of none Does patient have access to weapons?: No Criminal Charges Pending?: No Does patient have a court date: No Prior Inpatient Therapy: Prior Inpatient Therapy: Yes Prior Therapy Dates: Multiple Hospitalizations, Unable remember dates Prior Therapy Facilty/Provider(s): Multiple Hospitalizations, "I been everywhere" Reason for Treatment: Mood disorder Prior Outpatient Therapy: Prior Outpatient Therapy: Yes Prior Therapy Dates: Current Prior Therapy Facilty/Provider(s): [facility] Reason for Treatment: Medication Management Does patient have an ACCT team?: No Does patient have Intensive In-House Services?  : No Does patient have [facility] services? : No Does patient have P4CC services?: No  Past Medical History:  Past Medical History:  Diagnosis Date  . Anxiety   . Asthma   . Autism   . Bipolar 1 disorder (HCC)   . Brain damage    Front Lobe  . Developmental delay   . 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 PTSD (post-traumatic stress disorder)     Past Surgical History:  Procedure Laterality Date  . HERNIA REPAIR    . SHOULDER SURGERY     Family History: No family history on file. Family Psychiatric  History:  Social History:  Social History   Substance and Sexual Activity  Alcohol Use Yes   Comment: Occ     Social History   Substance and Sexual Activity  Drug Use Yes  . Types: Marijuana    Social History   Socioeconomic History  . Marital status: Single    Spouse name: Not on file  . Number of children: Not on file  . Years of education: Not on file  . Highest education level: Not on file  Occupational History  . Not on file  Tobacco Use  . Smoking status: Current Every Day Smoker    Packs/day: 1.50    Types: Cigarettes  . Smokeless tobacco: Never Used  Substance and Sexual Activity  . Alcohol use: Yes    Comment: Occ  . Drug use: Yes    Types: Marijuana  . Sexual activity: Not on  file  Other Topics Concern  . Not on file  Social History Narrative  . Not on file   Social Determinants of Health   Financial Resource Strain:   . Difficulty of Paying Living Expenses:   Food Insecurity:   . Worried About Programme researcher, broadcasting/film/videounning Out of Food in the Last Year:   . Baristaan Out of Food in the Last Year:   Transportation Needs:   . Freight forwarderLack of Transportation (Medical):   Marland Kitchen. Lack of Transportation (Non-Medical):   Physical Activity:   . Days of Exercise per Week:   . Minutes of Exercise per Session:   Stress:   . Feeling of Stress :   Social Connections:   . Frequency of Communication with Friends and Family:   . Frequency of Social Gatherings with Friends and Family:   . Attends Religious Services:   . Active Member of Clubs or Organizations:   . Attends BankerClub or Organization Meetings:   Marland Kitchen. Marital Status:    Additional Social History:    Allergies:   Allergies  Allergen Reactions  . Lamictal [Lamotrigine] Rash  . Lithium Other (See Comments)    Toxic levels    Labs:  Results for orders placed or performed during the hospital encounter of 08/08/19 (from the past 48 hour(s))  Comprehensive metabolic panel     Status: Abnormal   Collection Time: 08/08/19 10:26 AM  Result Value Ref Range   Sodium 139 135 - 145 mmol/L   Potassium 4.3 3.5 - 5.1 mmol/L   Chloride 103 98 - 111 mmol/L   CO2 30 22 - 32 mmol/L   Glucose, Bld 112 (H) 70 - 99 mg/dL    Comment: Glucose reference range applies only to samples taken after fasting for at least 8 hours.   BUN 11 6 - 20 mg/dL   Creatinine, Ser 9.48 0.61 - 1.24 mg/dL   Calcium 9.6 8.9 - 54.6 mg/dL   Total Protein 7.9 6.5 - 8.1 g/dL   Albumin 4.7 3.5 - 5.0 g/dL   AST 33 15 - 41 U/L   ALT 30 0 - 44 U/L   Alkaline Phosphatase 86 38 - 126 U/L   Total Bilirubin 0.7 0.3 - 1.2 mg/dL   GFR calc non Af Amer >60 >60 mL/min   GFR calc Af Amer >60 >60 mL/min   Anion gap 6 5 - 15    Comment: Performed at [facility], [address]  [place], [place] [address]  Ethanol     Status: None   Collection Time: 08/08/19 10:26 AM  Result Value Ref Range   Alcohol, Ethyl (B) <10 <10 mg/dL    Comment: (NOTE) Lowest detectable limit for serum alcohol is 10 mg/dL. For medical purposes only. Performed at [facility], [address] [place], [place] [address]   Salicylate level     Status: Abnormal   Collection Time: 08/08/19 10:26 AM  Result Value Ref Range   Salicylate Lvl <7.0 (L) 7.0 - 30.0 mg/dL    Comment: Performed at [facility], [address] [place], [place] [address]  Acetaminophen level     Status: Abnormal   Collection Time: 08/08/19 10:26 AM  Result Value Ref Range   Acetaminophen (Tylenol), Serum <10 (L) 10 - 30 ug/mL    Comment: (NOTE) Therapeutic concentrations vary significantly. A range of 10-30 ug/mL  may be an effective concentration for many patients. However, some  are best treated at concentrations outside of this range. Acetaminophen concentrations >150 ug/mL at 4 hours after ingestion  and >50 ug/mL at 12 hours after ingestion are often associated with  toxic reactions. Performed at [facility], [address] [place], [place] [address]   cbc     Status: Abnormal   Collection Time: 08/08/19 10:26 AM  Result Value Ref Range   WBC 9.5 4.0 - 10.5 K/uL   RBC 5.43 4.22 - 5.81 MIL/uL   Hemoglobin 15.0 13.0 - 17.0 g/dL   HCT 67.8 93.8 - 10.1 %   MCV 79.9 (L) 80.0 - 100.0 fL   MCH 27.6 26.0 - 34.0 pg   MCHC 34.6 30.0 - 36.0 g/dL   RDW 75.1 02.5 - 85.2 %   Platelets 138 (L) 150 - 400 K/uL   nRBC 0.0 0.0 - 0.2 %    Comment: Performed at [facility], [address] [place], [place] [address]  Urine Drug Screen, Qualitative     Status: Abnormal   Collection Time: 08/08/19  2:12 PM  Result Value Ref Range   Tricyclic, Ur Screen NONE DETECTED NONE DETECTED   Amphetamines, Ur Screen NONE DETECTED NONE DETECTED   MDMA (Ecstasy)Ur Screen NONE DETECTED NONE  DETECTED   Cocaine Metabolite,Ur [place] NONE DETECTED NONE DETECTED   Opiate, Ur Screen NONE DETECTED NONE DETECTED   Phencyclidine (PCP) Ur S NONE DETECTED NONE DETECTED   Cannabinoid 50 Ng, Ur [place] POSITIVE (A) NONE DETECTED   Barbiturates, Ur Screen NONE DETECTED NONE DETECTED   Benzodiazepine, Ur Scrn NONE DETECTED NONE DETECTED   Methadone Scn, Ur NONE DETECTED NONE DETECTED    Comment: (NOTE) Tricyclics + metabolites, urine    Cutoff 1000 ng/mL Amphetamines + metabolites, urine  Cutoff 1000 ng/mL MDMA (Ecstasy), urine              Cutoff 500 ng/mL Cocaine Metabolite, urine          Cutoff 300 ng/mL Opiate + metabolites, urine        Cutoff 300 ng/mL Phencyclidine (PCP), urine         Cutoff 25 ng/mL Cannabinoid, urine                 Cutoff 50 ng/mL Barbiturates + metabolites, urine  Cutoff 200 ng/mL Benzodiazepine, urine              Cutoff 200 ng/mL Methadone, urine                   Cutoff 300 ng/mL The urine drug screen provides only a preliminary, unconfirmed analytical test result and should not be used for non-medical purposes. Clinical consideration and professional judgment should be applied to any positive drug screen result due to possible interfering substances. A more specific alternate chemical method must be used in order to obtain a confirmed analytical result. Gas chromatography / mass spectrometry (GC/MS) is the preferred confirmat ory method. Performed at [facility], [address] [place], [place] [address]   SARS Coronavirus 2 by RT PCR (hospital order, performed in [facility] hospital lab) Nasopharyngeal Nasopharyngeal Swab     Status: None   Collection Time: 08/08/19  2:25 PM   Specimen: Nasopharyngeal Swab  Result Value Ref Range   SARS Coronavirus 2 NEGATIVE NEGATIVE    Comment: (NOTE) SARS-CoV-2 target nucleic acids are NOT DETECTED. The SARS-CoV-2 RNA is generally detectable in upper and lower respiratory specimens during the acute phase  of infection. The lowest concentration of SARS-CoV-2 viral copies this assay can detect is 250 copies / mL. A negative result does not preclude SARS-CoV-2 infection and should not be used as the sole basis for treatment or other patient management decisions.  A negative result may occur with improper specimen collection / handling, submission of specimen other than nasopharyngeal swab, presence of viral mutation(s) within the areas targeted by this assay, and inadequate number of viral copies (<250 copies / mL). A negative result must be combined with clinical observations, patient history, and epidemiological information. Fact Sheet for Patients:   StrictlyIdeas.no Fact Sheet for Healthcare Providers: BankingDealers.co.za This test is not yet approved or cleared  by the [place] FDA and has been authorized for detection and/or diagnosis of SARS-CoV-2 by FDA under an Emergency Use Authorization (EUA).  This EUA will remain in effect (meaning this test can be used) for the duration of the COVID-19 declaration under Section 564(b)(1) of the Act, 21 U.S.C. section 360bbb-3(b)(1), unless the authorization is terminated or revoked sooner. Performed at [facility], [address], [place], [place] [address]     Current Facility-Administered Medications  Medication Dose Route Frequency Provider Last Rate Last Admin  . benztropine (COGENTIN) tablet 2 mg  2 mg Oral  Leroy Kennedy, NP   2 mg at 08/08/19 2201  . buPROPion (WELLBUTRIN XL) 24 hr tablet 150 mg  150 mg Oral Daily Gillermo Murdoch, NP   150 mg at 08/08/19 2201  . paliperidone (INVEGA) 24 hr tablet 6 mg  6 mg Oral Belva Chimes, Adela Lank, NP      . traZODone (DESYREL) tablet 200 mg  200 mg Oral QHS PRN Gillermo Murdoch, NP   200 mg at 08/08/19 2201   Current Outpatient Medications  Medication Sig Dispense Refill  . ALPRAZolam (XANAX) 1 MG tablet  Take 1 mg by mouth 2 (two) times daily as needed for anxiety.     . benztropine (COGENTIN) 2 MG tablet Take 2 mg by mouth at bedtime.     Marland Kitchen buPROPion (WELLBUTRIN XL) 150 MG 24 hr tablet Take 150 mg by mouth daily.    . methylphenidate (RITALIN) 10 MG tablet Take 10 mg by mouth 2 (two) times daily.    . paliperidone (INVEGA) 6 MG 24 hr tablet Take 6 mg by mouth every morning.     . traZODone (DESYREL) 100 MG tablet Take 200 mg by mouth at bedtime as needed.       Musculoskeletal: Strength & Muscle Tone: within normal limits Gait & Station: normal Patient leans: N/A  Psychiatric Specialty Exam: Physical Exam  Nursing note and vitals reviewed. Constitutional: He is oriented to person, place, and time.  Cardiovascular: Normal rate.  Respiratory: Effort normal.  Musculoskeletal:        General: Normal range of motion.     Cervical back: Normal range of motion and neck supple.  Neurological: He is alert and oriented to person, place, and time.    Review of Systems  Psychiatric/Behavioral: The patient is nervous/anxious.   All other systems reviewed and are negative.   Blood pressure 117/70, pulse 97, temperature 98.4 F (36.9 C), temperature source Oral, resp. rate 18, height 5\' 6"  (1.676 m), weight 65.8 kg, SpO2 100 %.Body mass index is 23.4 kg/m.  General Appearance: Casual  Eye Contact:  Good  Speech:  Garbled and Slow  Volume:  Decreased  Mood:  Anxious and Depressed  Affect:  Congruent, Depressed and Flat  Thought Process:  Coherent  Orientation:  Full (Time, Place, and Person)  Thought Content:  Logical  Suicidal Thoughts:  No  Homicidal Thoughts:  No  Memory:  Immediate;   Fair Recent;   Fair Remote;   Fair  Judgement:  Poor  Insight:  Lacking  Psychomotor Activity:  Normal  Concentration:  Concentration: Fair and Attention Span: Fair  Recall:  of Knowledge:  Fair  Language:  Fair  Akathisia:  Negative  Handed:  Right  AIMS (if indicated):      Assets:  Desire for Improvement Financial Resources/Insurance Resilience Social Support  ADL's:  Intact  Cognition:  WNL  Sleep:    Good     Treatment Plan Summary: Daily contact with patient to assess and evaluate symptoms and progress in treatment, Medication management and Plan The patient will remain under observation overnight and reassess in the a.m. to determine if he meets criteria for psychiatric inpatient admission he could be discharged home  Disposition: No evidence of imminent risk to self or others at present.   Supportive therapy provided about ongoing stressors.  [employer], NP 08/09/2019 1:45 AM

## 2019-08-09 NOTE — ED Notes (Signed)
Pt. Alert and oriented, warm and dry, in no distress. Pt. Denies SI, HI, and AVH. Pt. Encouraged to let nursing staff know of any concerns or needs. 

## 2019-08-09 NOTE — ED Notes (Signed)

## 2019-08-09 NOTE — ED Notes (Signed)
Pt given lunch tray and a chocolate milk.

## 2019-08-09 NOTE — ED Notes (Signed)
Pt given supplies to shower 

## 2019-08-09 NOTE — ED Notes (Signed)
IVC/Consult Completed/ Obs overnight re-assess in Am

## 2019-08-09 NOTE — ED Provider Notes (Signed)
Emergency Medicine Observation Re-evaluation Note  It[place] Meding is a 29 y.o. male, seen on rounds today.  Pt initially presented to the ED for complaints of IVC Currently, the patient is resting in no acute distress.  Physical Exam  BP 117/70 (BP Location: Right Arm)   Pulse 97   Temp 98.4 F (36.9 C) (Oral)   Resp 18   Ht 5\' 6"  (1.676 m)   Wt 65.8 kg   SpO2 100%   BMI 23.40 kg/m  Physical Exam  ED Course / MDM  EKG:    I have reviewed the labs performed to date as well as medications administered while in observation.  Recent changes in the last 24 hours include no events overnight. Plan  Current plan is for psychiatric disposition. Patient is under full IVC at this time.   , MD 08/09/19 [phone]

## 2019-08-09 NOTE — BH Assessment (Signed)
TTS reassessment completed. Pt reports feeling better today after talking to mom and girlfriend. Pt denied any SI/HI/AH/VH. Pt reported plans to continue services with [facility] and was able to contract his safety.   Per Dr. Smith Robert pt will be discharged with the recommendation to follow up with current service providers.

## 2019-08-09 NOTE — Discharge Instructions (Signed)
Follow-up with your regular doctor and psychiatrist.  Continue taking your medications as prescribed.  Return to the ER for new or recurrent thoughts of suicide, any other thoughts of wanting to hurt yourself or anyone else, or any other new or worsening symptoms that concern you.  

## 2019-08-09 NOTE — ED Notes (Signed)
Pt given meal tray and a cup of sprite.  

## 2019-11-19 ENCOUNTER — Emergency Department
Admission: EM | Admit: 2019-11-19 | Discharge: 2019-11-19 | Discharge status: Against Medical Advice | Payer: Medicaid Other

## 2021-03-09 ENCOUNTER — Emergency Department: Payer: Medicaid Other

## 2021-03-09 ENCOUNTER — Emergency Department
Admission: EM | Admit: 2021-03-09 | Discharge: 2021-03-09 | Disposition: A | Discharge status: Home / Self Care | Payer: Medicaid Other | Attending: Emergency Medicine | Admitting: Emergency Medicine

## 2021-03-09 ENCOUNTER — Other Ambulatory Visit: Payer: Self-pay

## 2021-03-09 DIAGNOSIS — F840 Autistic disorder: Secondary | ICD-10-CM | POA: Insufficient documentation

## 2021-03-09 DIAGNOSIS — F17210 Nicotine dependence, cigarettes, uncomplicated: Secondary | ICD-10-CM | POA: Diagnosis not present

## 2021-03-09 DIAGNOSIS — R000 Tachycardia, unspecified: Secondary | ICD-10-CM | POA: Insufficient documentation

## 2021-03-09 DIAGNOSIS — Z20822 Contact with and (suspected) exposure to covid-19: Secondary | ICD-10-CM | POA: Insufficient documentation

## 2021-03-09 DIAGNOSIS — R509 Fever, unspecified: Secondary | ICD-10-CM | POA: Insufficient documentation

## 2021-03-09 DIAGNOSIS — J45909 Unspecified asthma, uncomplicated: Secondary | ICD-10-CM | POA: Insufficient documentation

## 2021-03-09 LAB — URINALYSIS, COMPLETE (UACMP) WITH MICROSCOPIC
Bacteria, UA: NONE SEEN
Bilirubin Urine: NEGATIVE
Glucose, UA: NEGATIVE mg/dL
Hgb urine dipstick: NEGATIVE
Ketones, ur: NEGATIVE mg/dL
Leukocytes,Ua: NEGATIVE
Nitrite: NEGATIVE
Protein, ur: NEGATIVE mg/dL
Specific Gravity, Urine: 1.016 (ref 1.005–1.030)
pH: 5 (ref 5.0–8.0)

## 2021-03-09 LAB — CBC WITH DIFFERENTIAL/PLATELET
Abs Immature Granulocytes: 0.07 10*3/uL (ref 0.00–0.07)
Basophils Absolute: 0 10*3/uL (ref 0.0–0.1)
Basophils Relative: 0 %
Eosinophils Absolute: 0 10*3/uL (ref 0.0–0.5)
Eosinophils Relative: 0 %
HCT: 48.2 % (ref 39.0–52.0)
Hemoglobin: 16.4 g/dL (ref 13.0–17.0)
Immature Granulocytes: 1 %
Lymphocytes Relative: 12 %
Lymphs Abs: 1.9 10*3/uL (ref 0.7–4.0)
MCH: 28.2 pg (ref 26.0–34.0)
MCHC: 34 g/dL (ref 30.0–36.0)
MCV: 82.8 fL (ref 80.0–100.0)
Monocytes Absolute: 0.9 10*3/uL (ref 0.1–1.0)
Monocytes Relative: 6 %
Neutro Abs: 12.3 10*3/uL — ABNORMAL HIGH (ref 1.7–7.7)
Neutrophils Relative %: 81 %
Platelets: 124 10*3/uL — ABNORMAL LOW (ref 150–400)
RBC: 5.82 MIL/uL — ABNORMAL HIGH (ref 4.22–5.81)
RDW: 12.3 % (ref 11.5–15.5)
WBC: 15.1 10*3/uL — ABNORMAL HIGH (ref 4.0–10.5)
nRBC: 0 % (ref 0.0–0.2)

## 2021-03-09 LAB — COMPREHENSIVE METABOLIC PANEL
ALT: 18 U/L (ref 0–44)
AST: 27 U/L (ref 15–41)
Albumin: 4.6 g/dL (ref 3.5–5.0)
Alkaline Phosphatase: 99 U/L (ref 38–126)
Anion gap: 6 (ref 5–15)
BUN: 8 mg/dL (ref 6–20)
CO2: 28 mmol/L (ref 22–32)
Calcium: 9.2 mg/dL (ref 8.9–10.3)
Chloride: 105 mmol/L (ref 98–111)
Creatinine, Ser: 0.86 mg/dL (ref 0.61–1.24)
GFR, Estimated: >60 mL/min (ref >60)
Glucose, Bld: 95 mg/dL (ref 70–99)
Potassium: 3.8 mmol/L (ref 3.5–5.1)
Sodium: 139 mmol/L (ref 135–145)
Total Bilirubin: 0.7 mg/dL (ref 0.3–1.2)
Total Protein: 7.5 g/dL (ref 6.5–8.1)

## 2021-03-09 LAB — RESP PANEL BY RT-PCR (FLU A&B, COVID) ARPGX2
Influenza A by PCR: NEGATIVE
Influenza B by PCR: NEGATIVE
SARS Coronavirus 2 by RT PCR: NEGATIVE

## 2021-03-09 MED ORDER — IBUPROFEN 400 MG PO TABS
400.0000 mg | ORAL_TABLET | Freq: Once | ORAL | Status: AC
  Administered 2021-03-09: 13:00:00 400 mg via ORAL
  Filled 2021-03-09: qty 1

## 2021-03-09 NOTE — ED Provider Notes (Signed)
[facility]  ____________________________________________   Event Date/Time   First MD Initiated Contact with Patient 03/09/21 1150     (approximate)  I have reviewed the triage vital signs and the nursing notes.   HISTORY  Chief Complaint Fever    HPI Jerry Benton is a 30 y.o. male with past medical history of anxiety, asthma, bipolar disorder, developmental delay, PTSD and manic disorder who presents with cough fevers chills myalgias.  Patient symptoms started yesterday.  He endorses headache myalgias cough dyspnea chest pain low-grade fever decreased appetite and nausea.  Also had some diarrhea this morning.  No sick contacts.  He has mild cough.  Back pain is diffuse and extends into the chest.  He notes that he has had a daily headache for several weeks, was seen in emergency department about 2 weeks ago had a CTA that was negative for aneurysm and has follow-up with neurology for this.  His headache today is unchanged from his chronic daily headache.  He has no new neurologic symptoms.  Patient does have some mild diffuse abdominal pain that is nonfocal.  Also does endorse dysuria but says that this is a chronic thing for him as well and he has been told that there is nothing causing it.         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 Authorizing Provider  ALPRAZolam Prudy Feeler) 1 MG tablet Take 1 mg by mouth 2 (two) times daily as needed for anxiety.     [provider]  benztropine (COGENTIN) 2 MG tablet Take 2 mg by mouth at bedtime.     [provider]  buPROPion (WELLBUTRIN XL) 150 MG 24 hr tablet Take 150 mg by mouth daily.    [provider]  methylphenidate (RITALIN) 10 MG tablet Take 10 mg by mouth 2 (two) times daily.    [provider]  paliperidone (INVEGA) 6 MG 24 hr tablet Take 6 mg by mouth every morning.     [provider]  traZODone (DESYREL) 100 MG tablet Take 200 mg by mouth at bedtime as needed.     [provider]    Allergies Lamictal [lamotrigine] and Lithium  No family history on file.  Social History Social History   Tobacco Use   Smoking status: Every Day    Packs/day: 1.50    Types: Cigarettes   Smokeless tobacco: Never  Vaping Use   Vaping Use: Never used  Substance Use Topics   Alcohol use: Yes    Comment: Occ   Drug use: Yes    Types: Marijuana    Review of Systems   Review of Systems  Constitutional:  Positive for activity change, appetite change, chills, fatigue  and fever.  Respiratory:  Positive for cough and shortness of breath.   Cardiovascular:  Positive for chest pain.  Gastrointestinal:  Positive for abdominal pain, diarrhea and nausea. Negative for constipation and vomiting.  Genitourinary:  Positive for dysuria.  Musculoskeletal:  Positive for back pain and myalgias.  Neurological:  Positive for headaches. Negative for weakness and numbness.  All other systems reviewed and are negative.  Physical Exam Updated Vital Signs BP 126/75 (BP Location: Left Arm)    Pulse 90    Temp 98.4 F (36.9 C) (Oral)    Resp 18    Ht 5\' 6"  (1.676 m)    Wt 54.9 kg    SpO2 100%    BMI 19.53 kg/m   Physical Exam Vitals and nursing note reviewed.  Constitutional:      General: He is not in acute distress.     Appearance: Normal appearance.  HENT:     Head: Normocephalic and atraumatic.     Mouth/Throat:     Pharynx: No oropharyngeal exudate or posterior oropharyngeal erythema.  Eyes:     General: No scleral icterus.    Conjunctiva/sclera: Conjunctivae normal.  Cardiovascular:     Rate and Rhythm: Regular rhythm. Tachycardia present.  Pulmonary:     Effort: Pulmonary effort is normal. No respiratory distress.     Breath sounds: Normal breath sounds. No wheezing.  Abdominal:     General: Abdomen is flat. There is no distension.     Palpations: Abdomen is soft.     Tenderness: There is no abdominal tenderness. There is no guarding.  Musculoskeletal:        General: No deformity or signs of injury.     Cervical back: Normal range of motion.  Skin:    Coloration: Skin is not jaundiced or pale.  Neurological:     General: No focal deficit present.     Mental Status: He is alert and oriented to person, place, and time. Mental status is at baseline.  Psychiatric:        Mood and Affect: Mood normal.        Behavior: Behavior normal.     LABS (all labs ordered are listed, but only abnormal results are displayed)  Labs Reviewed  CBC WITH DIFFERENTIAL/PLATELET - Abnormal; Notable for the following components:      Result Value   WBC 15.1 (*)    RBC 5.82 (*)    Platelets 124 (*)    Neutro Abs 12.3 (*)    All other components within normal limits  URINALYSIS, COMPLETE (UACMP) WITH MICROSCOPIC - Abnormal; Notable for the following components:   Color, Urine YELLOW (*)    APPearance CLEAR (*)    All other components within normal limits  RESP PANEL BY RT-PCR (FLU A&B, COVID) ARPGX2  COMPREHENSIVE METABOLIC PANEL   ____________________________________________  EKG  NSR, nml axis, nml intervals, no acute ischemic changes  ____________________________________________  RADIOLOGY , personally viewed and evaluated these images (plain radiographs) as part of my  medical decision making, as well as reviewing the written report by the radiologist.  ED MD interpretation:  I reviewed the CXR which does not show any acute cardiopulmonary process      ____________________________________________   PROCEDURES  Procedure(s) performed (including Critical Care):  Procedures   ____________________________________________   INITIAL IMPRESSION / ASSESSMENT AND PLAN / ED COURSE     The patient is a 30 year old male with a history of intellectual disability who presents with mom  for multiple symptoms, including cough body aches fever headache back pain etc.  His symptoms started yesterday although he has had a chronic headache for the last several weeks and has follow-up with neurology for this.  Patient is tachycardic to 130 in triage.  Mom notes that he is chronically tachycardic and heart rate is usually above 100.  Used to be on propranolol for this but is not been on it in many years.  His abdomen is benign, lungs are clear and his back is without focal midline tenderness, tenderness is diffuse.  Presentation is most consistent with a viral type syndrome.  Low suspicion for intra-abdominal process given his benign abdomen.  Low suspicion for meningitis/encephalitis given the headache is been chronic and he has no new neurologic symptoms and is not altered.  Low suspicion for spinal epidural abscess given his back pain is diffuse and not localized, feel that is more likely to be myalgias associated with a viral syndrome.  Will treat supportively with NSAIDs.  We will also get a chest x-ray given his dyspnea and chest pain as well as an EKG.  Did look back at his last ED visit and he was tachycardic to 118 at that time as well.  Less concerned about his tachycardia as given this seems to be chronic per mom.  Patient's UA is negative for infection, chest x-ray does not show any infiltrate or other acute process.  Patient's labs do show leukocytosis.  He has  been afebrile in the ED and his tachycardia resolved.  Patient is overall very well-appearing.  Do not feel that he requires additional work-up at this time.  We discussed return precautions and follow-up.  He is stable for discharge.      ____________________________________________   FINAL CLINICAL IMPRESSION(S) / ED DIAGNOSES  Final diagnoses:  None     ED Discharge Orders     None        Note:  This document was prepared using Dragon voice recognition software and may include unintentional dictation errors.    Georga Hacking, MD 03/09/21 [phone]

## 2021-03-09 NOTE — ED Notes (Signed)
See triage note. ED provider at bedside. Pt states used to take propanolol for tachycardia but has been off for over 10 years. HR normally is >100 resting.  Pt c/o HA, body aches, cough since 2 weeks.  Pt is daily smoker, 1-2 packs/day.  NAD, ambulatory.

## 2021-03-09 NOTE — ED Triage Notes (Signed)
Pt states he has had fever, cough, congestion, headache, and body aches- pt states this has been going on for "quite a while"- pt has a hx of "overactive heart"- pt states he has had covid and flu vaccines

## 2021-03-09 NOTE — Discharge Instructions (Signed)
Your COVID and influenza test were negative.  Your chest x-ray did not show any pneumonia and your urine sample was not consistent with infection.  You likely have a viral illness which is causing your body aches and fevers.  You can take Tylenol and Motrin for pain.  If your symptoms are worsening or you develop any new symptoms that are concerning to you, please return to the emergency department.  Otherwise please follow-up with your primary care provider within the next 2 to 3 days for repeat evaluation.

## 2021-07-03 ENCOUNTER — Other Ambulatory Visit: Payer: Self-pay

## 2021-07-03 ENCOUNTER — Emergency Department
Admission: EM | Admit: 2021-07-03 | Discharge: 2021-07-03 | Disposition: A | Discharge status: Home / Self Care | Payer: Medicaid Other | Attending: Emergency Medicine | Admitting: Emergency Medicine

## 2021-07-03 ENCOUNTER — Encounter: Payer: Self-pay | Admitting: Emergency Medicine

## 2021-07-03 ENCOUNTER — Emergency Department: Payer: Medicaid Other

## 2021-07-03 DIAGNOSIS — K219 Gastro-esophageal reflux disease without esophagitis: Secondary | ICD-10-CM | POA: Diagnosis not present

## 2021-07-03 DIAGNOSIS — F17290 Nicotine dependence, other tobacco product, uncomplicated: Secondary | ICD-10-CM | POA: Insufficient documentation

## 2021-07-03 DIAGNOSIS — R000 Tachycardia, unspecified: Secondary | ICD-10-CM | POA: Insufficient documentation

## 2021-07-03 DIAGNOSIS — J45909 Unspecified asthma, uncomplicated: Secondary | ICD-10-CM | POA: Insufficient documentation

## 2021-07-03 DIAGNOSIS — R1013 Epigastric pain: Secondary | ICD-10-CM

## 2021-07-03 DIAGNOSIS — R1011 Right upper quadrant pain: Secondary | ICD-10-CM | POA: Diagnosis present

## 2021-07-03 LAB — COMPREHENSIVE METABOLIC PANEL
ALT: 16 U/L (ref 0–44)
AST: 35 U/L (ref 15–41)
Albumin: 4.4 g/dL (ref 3.5–5.0)
Alkaline Phosphatase: 90 U/L (ref 38–126)
Anion gap: 7 (ref 5–15)
BUN: 9 mg/dL (ref 6–20)
CO2: 29 mmol/L (ref 22–32)
Calcium: 9.4 mg/dL (ref 8.9–10.3)
Chloride: 103 mmol/L (ref 98–111)
Creatinine, Ser: 0.98 mg/dL (ref 0.61–1.24)
GFR, Estimated: >60 mL/min (ref >60)
Glucose, Bld: 123 mg/dL — ABNORMAL HIGH (ref 70–99)
Potassium: 4.2 mmol/L (ref 3.5–5.1)
Sodium: 139 mmol/L (ref 135–145)
Total Bilirubin: 0.6 mg/dL (ref 0.3–1.2)
Total Protein: 7.5 g/dL (ref 6.5–8.1)

## 2021-07-03 LAB — CBC WITH DIFFERENTIAL/PLATELET
Abs Immature Granulocytes: 0.01 10*3/uL (ref 0.00–0.07)
Basophils Absolute: 0 10*3/uL (ref 0.0–0.1)
Basophils Relative: 0 %
Eosinophils Absolute: 0.1 10*3/uL (ref 0.0–0.5)
Eosinophils Relative: 1 %
HCT: 45.2 % (ref 39.0–52.0)
Hemoglobin: 15.2 g/dL (ref 13.0–17.0)
Immature Granulocytes: 0 %
Lymphocytes Relative: 28 %
Lymphs Abs: 1.8 10*3/uL (ref 0.7–4.0)
MCH: 27.4 pg (ref 26.0–34.0)
MCHC: 33.6 g/dL (ref 30.0–36.0)
MCV: 81.4 fL (ref 80.0–100.0)
Monocytes Absolute: 0.4 10*3/uL (ref 0.1–1.0)
Monocytes Relative: 5 %
Neutro Abs: 4.4 10*3/uL (ref 1.7–7.7)
Neutrophils Relative %: 66 %
Platelets: 129 10*3/uL — ABNORMAL LOW (ref 150–400)
RBC: 5.55 MIL/uL (ref 4.22–5.81)
RDW: 12.2 % (ref 11.5–15.5)
WBC: 6.7 10*3/uL (ref 4.0–10.5)
nRBC: 0 % (ref 0.0–0.2)

## 2021-07-03 LAB — LIPASE, BLOOD: Lipase: 36 U/L (ref 11–51)

## 2021-07-03 LAB — TROPONIN I (HIGH SENSITIVITY): Troponin I (High Sensitivity): 3 ng/L (ref <18)

## 2021-07-03 NOTE — ED Triage Notes (Signed)
Pt comes into the ED via POV c/o foreign object possibly being inhaled.  Pt states he was smoking from a glass bowl and there were shards of glass when he inhaled his marijuana.  Pt in NAD at this time.  ?

## 2021-07-03 NOTE — ED Provider Notes (Signed)
? ?[facility] ?Provider Note ? ? ? Event Date/Time  ? First MD Initiated Contact with Patient 07/03/21 1250   ?  (approximate) ? ? ?History  ? ?Chief Complaint ?Abdominal Pain ? ?HPI ? ?Jerry Benton is a 31 y.o. male with past medical history of intellectual disability, bipolar disorder, and TBI who presents to the ED for abdominal pain.  Patient reports that 2 days ago he was smoking marijuana when his glass bowl broke.  He then decided to scrape off some of the resin into another pipe and smoke it.  He was feeling well for the next 24 hours but then yesterday evening began having pain in both upper quadrants of his abdomen.  He describes the pain as constant and dull, not exacerbated or alleviated by anything in particular.  He states that sometimes the pain moves up into both sides of his chest, but he denies any nausea or vomiting.  He has not had any fever or shortness of breath, does endorse chronic cough associated with his asthma that is unchanged today.  He is concerned that there is could be some glass in his lungs from what he smoked.  He does report chronic abdominal issues associated with GERD, for which he takes Protonix. ?  ? ? ?Physical Exam  ? ?Triage Vital Signs: ?ED Triage Vitals  ?Enc Vitals Group  ?   BP 07/03/21 1249 127/79  ?   Pulse Rate 07/03/21 1249 (!) 111  ?   Resp 07/03/21 1249 17  ?   Temp 07/03/21 1249 98.4 ?F (36.9 ?C)  ?   Temp Source 07/03/21 1249 Oral  ?   SpO2 07/03/21 1249 96 %  ?   Weight 07/03/21 1248 121 lb 0.5 oz (54.9 kg)  ?   Height 07/03/21 1248 5\' 6"  (1.676 m)  ?   Head Circumference --   ?   Peak Flow --   ?   Pain Score 07/03/21 1248 5  ?   Pain Loc --   ?   Pain Edu? --   ?   Excl. in [place]? --   ? ? ?Most recent vital signs: ?Vitals:  ? 07/03/21 1249  ?BP: 127/79  ?Pulse: (!) 111  ?Resp: 17  ?Temp: 98.4 ?F (36.9 ?C)  ?SpO2: 96%  ? ? ?Constitutional: Alert and oriented. ?Eyes: Conjunctivae are normal. ?Head: Atraumatic. ?Nose: No  congestion/rhinnorhea. ?Mouth/Throat: Mucous membranes are moist.  ?Cardiovascular: Normal rate, regular rhythm. Grossly normal heart sounds.  2+ radial pulses bilaterally. ?Respiratory: Normal respiratory effort.  No retractions. Lungs CTAB.  No chest wall tenderness to palpation. ?Gastrointestinal: Soft and nontender. No distention. ?Musculoskeletal: No lower extremity tenderness nor edema.  ?Neurologic:  Normal speech and language. No gross focal neurologic deficits are appreciated. ? ? ? ?ED Results / Procedures / Treatments  ? ?Labs ?(all labs ordered are listed, but only abnormal results are displayed) ?Labs Reviewed  ?CBC WITH DIFFERENTIAL/PLATELET - Abnormal; Notable for the following components:  ?    Result Value  ? Platelets 129 (*)   ? All other components within normal limits  ?COMPREHENSIVE METABOLIC PANEL - Abnormal; Notable for the following components:  ? Glucose, Bld 123 (*)   ? All other components within normal limits  ?LIPASE, BLOOD  ?TROPONIN I (HIGH SENSITIVITY)  ? ? ? ?EKG ? ?ED ECG REPORT ?Tempie Hoist, the attending physician, personally viewed and interpreted this ECG. ? ? Date: 07/03/2021 ? EKG Time: 14:31 ? Rate: 63 ? Rhythm:  normal sinus rhythm ? Axis: Normal ? Intervals:none ? ST&T Change: None ? ?RADIOLOGY ?Chest x-ray reviewed by me and shows no infiltrate, edema, or effusion. ? ?PROCEDURES: ? ?Critical Care performed: No ? ?Procedures ? ? ?MEDICATIONS ORDERED IN ED: ?Medications - No data to display ? ? ?IMPRESSION / MDM / ASSESSMENT AND PLAN / ED COURSE  ?I reviewed the triage vital signs and the nursing notes. ?             ?               ? ?31 y.o. male with past medical history of bipolar disorder, autistic spectrum disorder, TBI, and intellectual disability who presents to the ED for about 24 hours of bilateral upper quadrant abdominal pain that will occasionally move upwards into his chest. ? ?Differential diagnosis includes, but is not limited to, inhaled foreign body,  intra-abdominal foreign body, ACS, PE, pneumonia, pneumothorax, gastritis, GERD, pancreatitis, hepatitis, biliary pathology. ? ?Patient nontoxic-appearing and in no acute distress, vitals remarkable only for mild tachycardia but otherwise reassuring.  His abdominal exam is benign and he has no chest wall tenderness.  He is primarily concerned about the possibility of inhaled glass, although this seems unlikely to be the source of his symptoms given pain is primarily located in his abdomen.  We will screen chest x-ray and EKG given pain occasionally moves upwards into his chest, given benign abdominal exam we will hold off on CT imaging but instead screening labs including LFTs and lipase.  He does deal with chronic abdominal issues related to GERD, which may be a contributing factor. ? ?EKG shows no evidence of arrhythmia or ischemia and chest x-ray is unremarkable.  Troponin is also within normal limits and I doubt cardiac etiology for his symptoms.  LFTs and lipase are within normal limits, BMP without AKI or electrolyte abnormality.  With no foreign body obvious on chest x-ray, I have low suspicion that his symptoms are related to the inhalation event he describes.  Symptoms more likely related to his chronic GERD given they are present primarily in his upper abdomen.  He is on Protonix and was counseled to take Tums and Pepcid as needed for ongoing symptoms.  He was counseled to follow-up with his PCP and otherwise return to the ED for new worsening symptoms.  Patient agrees with plan. ? ?  ? ? ?FINAL CLINICAL IMPRESSION(S) / ED DIAGNOSES  ? ?Final diagnoses:  ?Epigastric pain  ?Gastroesophageal reflux disease without esophagitis  ? ? ? ?Rx / DC Orders  ? ?ED Discharge Orders   ? ? None  ? ?  ? ? ? ?Note:  This document was prepared using Dragon voice recognition software and may include unintentional dictation errors. ?  ?Blake Divine, MD ?07/03/21 1632 ? ?

## 2022-01-09 ENCOUNTER — Emergency Department
Admission: EM | Admit: 2022-01-09 | Discharge: 2022-01-09 | Disposition: A | Discharge status: Home / Self Care | Payer: Medicaid Other | Attending: Emergency Medicine | Admitting: Emergency Medicine

## 2022-01-09 DIAGNOSIS — K625 Hemorrhage of anus and rectum: Secondary | ICD-10-CM

## 2022-01-09 DIAGNOSIS — K921 Melena: Secondary | ICD-10-CM | POA: Diagnosis present

## 2022-01-09 LAB — CBC WITH DIFFERENTIAL/PLATELET
Abs Immature Granulocytes: 0.04 10*3/uL (ref 0.00–0.07)
Basophils Absolute: 0 10*3/uL (ref 0.0–0.1)
Basophils Relative: 0 %
Eosinophils Absolute: 0 10*3/uL (ref 0.0–0.5)
Eosinophils Relative: 0 %
HCT: 46.8 % (ref 39.0–52.0)
Hemoglobin: 15.5 g/dL (ref 13.0–17.0)
Immature Granulocytes: 0 %
Lymphocytes Relative: 24 %
Lymphs Abs: 2.2 10*3/uL (ref 0.7–4.0)
MCH: 27.7 pg (ref 26.0–34.0)
MCHC: 33.1 g/dL (ref 30.0–36.0)
MCV: 83.6 fL (ref 80.0–100.0)
Monocytes Absolute: 0.4 10*3/uL (ref 0.1–1.0)
Monocytes Relative: 5 %
Neutro Abs: 6.6 10*3/uL (ref 1.7–7.7)
Neutrophils Relative %: 71 %
Platelets: 166 10*3/uL (ref 150–400)
RBC: 5.6 MIL/uL (ref 4.22–5.81)
RDW: 12.5 % (ref 11.5–15.5)
WBC: 9.3 10*3/uL (ref 4.0–10.5)
nRBC: 0 % (ref 0.0–0.2)

## 2022-01-09 LAB — URINALYSIS, ROUTINE W REFLEX MICROSCOPIC
Bacteria, UA: NONE SEEN
Bilirubin Urine: NEGATIVE
Glucose, UA: NEGATIVE mg/dL
Hgb urine dipstick: NEGATIVE
Ketones, ur: NEGATIVE mg/dL
Leukocytes,Ua: NEGATIVE
Nitrite: NEGATIVE
Protein, ur: NEGATIVE mg/dL
Specific Gravity, Urine: 1.009 (ref 1.005–1.030)
Squamous Epithelial / HPF: NONE SEEN (ref 0–5)
WBC, UA: NONE SEEN WBC/hpf (ref 0–5)
pH: 7 (ref 5.0–8.0)

## 2022-01-09 LAB — COMPREHENSIVE METABOLIC PANEL
ALT: 20 U/L (ref 0–44)
AST: 31 U/L (ref 15–41)
Albumin: 4.5 g/dL (ref 3.5–5.0)
Alkaline Phosphatase: 91 U/L (ref 38–126)
Anion gap: 8 (ref 5–15)
BUN: 12 mg/dL (ref 6–20)
CO2: 28 mmol/L (ref 22–32)
Calcium: 9.5 mg/dL (ref 8.9–10.3)
Chloride: 103 mmol/L (ref 98–111)
Creatinine, Ser: 0.99 mg/dL (ref 0.61–1.24)
GFR, Estimated: >60 mL/min (ref >60)
Glucose, Bld: 90 mg/dL (ref 70–99)
Potassium: 4.2 mmol/L (ref 3.5–5.1)
Sodium: 139 mmol/L (ref 135–145)
Total Bilirubin: 0.6 mg/dL (ref 0.3–1.2)
Total Protein: 7.6 g/dL (ref 6.5–8.1)

## 2022-01-09 NOTE — ED Provider Triage Note (Signed)
Emergency Medicine Provider Triage Evaluation Note  Jerry Benton , a 31 y.o. male  was evaluated in triage.  Pt complains of BRBPR that began this morning. No pain. Recently had an endoscopy. Has RLQ abdominal pain.  Review of Systems  Positive: BRBPR, abd pain Negative: N/v/d, fever  Physical Exam  There were no vitals taken for this visit. Gen:   Awake, no distress   Resp:  Normal effort  MSK:   Moves extremities without difficulty  Other:    Medical Decision Making  Medically screening exam initiated at 1:55 PM.  Appropriate orders placed.  Jerry Benton was informed that the remainder of the evaluation will be completed by another provider, this initial triage assessment does not replace that evaluation, and the importance of remaining in the ED until their evaluation is complete.     Marquette Old, PA-C 01/09/22 1407

## 2022-01-09 NOTE — ED Triage Notes (Addendum)
Patient reports BRBPR that began this morning (noticed approx. 50 ml); Denies pain when having bowel movement; Does have 6/10 RIGHT side abdominal pain

## 2022-01-09 NOTE — ED Provider Notes (Signed)
[facility] Provider Note    Event Date/Time   First MD Initiated Contact with Patient 01/09/22 1458     (approximate)   History   Blood in stools  HPI  Jerry Benton is a 31 y.o. male who presents with complaints of bright red blood per rectum this morning after having a bowel movement.  Patient reports he had endoscopy yesterday.  He denies abdominal pain to me.  Not on any blood thinners.  Small mount of bright red blood on toilet paper, none since.     Physical Exam   Triage Vital Signs: ED Triage Vitals  Enc Vitals Group     BP 01/09/22 1357 135/83     Pulse Rate 01/09/22 1357 79     Resp 01/09/22 1357 19     Temp 01/09/22 1357 98.5 F (36.9 C)     Temp Source 01/09/22 1357 Oral     SpO2 01/09/22 1357 100 %     Weight 01/09/22 1359 67.6 kg (149 lb)     Height 01/09/22 1359 1.676 m (5\' 6" )     Head Circumference --      Peak Flow --      Pain Score 01/09/22 1358 6     Pain Loc --      Pain Edu? --      Excl. in [place]? --     Most recent vital signs: Vitals:   01/09/22 1357 01/09/22 1519  BP: 135/83 131/79  Pulse: 79 71  Resp: 19 17  Temp: 98.5 F (36.9 C) 98.5 F (36.9 C)  SpO2: 100% 98%     General: Awake, no distress.  CV:  Good peripheral perfusion.  Resp:  Normal effort.  Abd:  No distention.  Rectal exam normal, no bleeding external hemorrhoids Other:     ED Results / Procedures / Treatments   Labs (all labs ordered are listed, but only abnormal results are displayed) Labs Reviewed  URINALYSIS, ROUTINE W REFLEX MICROSCOPIC - Abnormal; Notable for the following components:      Result Value   Color, Urine STRAW (*)    APPearance CLEAR (*)    All other components within normal limits  COMPREHENSIVE METABOLIC PANEL  CBC WITH DIFFERENTIAL/PLATELET     EKG     RADIOLOGY     PROCEDURES:  Critical Care performed:   Procedures   MEDICATIONS ORDERED IN ED: Medications - No data to  display   IMPRESSION / MDM / [place] / ED COURSE  I reviewed the triage vital signs and the nursing notes. Patient's presentation is most consistent with acute presentation with potential threat to life or bodily function.  Patient presents with rectal bleeding as described above status post endoscopy.  He has not had any melena.  Describes bright red blood on toilet paper, no further bleeding.  Not consistent with endoscopy related bleed specially because he is quite well-appearing and hemoglobin is normal.  More suspicious for internal hemorrhoidal bleeding.  Recommend supportive care, outpatient follow-up unless any worsening in which case he will return to the emergency department        FINAL CLINICAL IMPRESSION(S) / ED DIAGNOSES   Final diagnoses:  Rectal bleeding     Rx / DC Orders   ED Discharge Orders     None        Note:  This document was prepared using Dragon voice recognition software and may include unintentional dictation errors.   Lavonia Drafts, MD  01/09/22 1601  

## 2022-03-11 ENCOUNTER — Other Ambulatory Visit: Payer: Self-pay

## 2022-03-11 ENCOUNTER — Emergency Department
Admission: EM | Admit: 2022-03-11 | Discharge: 2022-03-11 | Discharge status: Against Medical Advice | Payer: Medicaid Other | Attending: Emergency Medicine | Admitting: Emergency Medicine

## 2022-03-11 ENCOUNTER — Emergency Department: Payer: Medicaid Other

## 2022-03-11 DIAGNOSIS — Z5321 Procedure and treatment not carried out due to patient leaving prior to being seen by health care provider: Secondary | ICD-10-CM | POA: Insufficient documentation

## 2022-03-11 DIAGNOSIS — R202 Paresthesia of skin: Secondary | ICD-10-CM | POA: Diagnosis not present

## 2022-03-11 DIAGNOSIS — M545 Low back pain, unspecified: Secondary | ICD-10-CM | POA: Diagnosis present

## 2022-03-11 DIAGNOSIS — R300 Dysuria: Secondary | ICD-10-CM | POA: Insufficient documentation

## 2022-03-11 LAB — URINALYSIS, ROUTINE W REFLEX MICROSCOPIC
Bilirubin Urine: NEGATIVE
Glucose, UA: NEGATIVE mg/dL
Hgb urine dipstick: NEGATIVE
Ketones, ur: NEGATIVE mg/dL
Leukocytes,Ua: NEGATIVE
Nitrite: NEGATIVE
Protein, ur: NEGATIVE mg/dL
Specific Gravity, Urine: 1.023 (ref 1.005–1.030)
pH: 5 (ref 5.0–8.0)

## 2022-03-11 NOTE — ED Triage Notes (Signed)
Pt to ED via POV stating that he is having mid back pain that gets worse when he lays a certain way or sits for too long. Pt states that he also has some numbness and tingling in his left foot when he walks too much.

## 2022-03-11 NOTE — ED Provider Triage Note (Signed)
  Emergency Medicine Provider Triage Evaluation Note  Jerry Benton , a 31 y.o.male,  was evaluated in triage.  Pt complains of low back pain.  Patient states this been going on for years, but has been particularly worse over the past couple weeks.  He states that he gets some numbness/tingling in his left lower extremity as well.  Additionally states that he gets a burning sensation when he urinates sometimes as well.   Review of Systems  Positive: Back pain, paresthesia, dysuria Negative: Denies fever, chest pain, vomiting  Physical Exam   Vitals:   03/11/22 1509  BP: 129/75  Pulse: 76  Resp: 16  Temp: 98.6 F (37 C)  SpO2: 100%   Gen:   Awake, no distress   Resp:  Normal effort  MSK:   Moves extremities without difficulty  Other:    Medical Decision Making  Given the patient's initial medical screening exam, the following diagnostic evaluation has been ordered. The patient will be placed in the appropriate treatment space, once one is available, to complete the evaluation and treatment. I have discussed the plan of care with the patient and I have advised the patient that an ED physician or mid-level practitioner will reevaluate their condition after the test results have been received, as the results may give them additional insight into the type of treatment they may need.    Diagnostics: Urinalysis, CT lumbar  Treatments: none immediately   Varney Daily, [place] 03/11/22 1516

## 2022-08-27 ENCOUNTER — Emergency Department
Admission: EM | Admit: 2022-08-27 | Discharge: 2022-08-27 | Discharge status: Against Medical Advice | Payer: Medicaid Other | Attending: Emergency Medicine | Admitting: Emergency Medicine

## 2022-08-27 ENCOUNTER — Other Ambulatory Visit: Payer: Self-pay

## 2022-08-27 ENCOUNTER — Encounter: Payer: Self-pay | Admitting: Emergency Medicine

## 2022-08-27 DIAGNOSIS — M549 Dorsalgia, unspecified: Secondary | ICD-10-CM | POA: Insufficient documentation

## 2022-08-27 DIAGNOSIS — Z5321 Procedure and treatment not carried out due to patient leaving prior to being seen by health care provider: Secondary | ICD-10-CM | POA: Diagnosis not present

## 2022-08-27 DIAGNOSIS — R197 Diarrhea, unspecified: Secondary | ICD-10-CM | POA: Insufficient documentation

## 2022-08-27 DIAGNOSIS — R1110 Vomiting, unspecified: Secondary | ICD-10-CM | POA: Insufficient documentation

## 2022-08-27 LAB — URINALYSIS, ROUTINE W REFLEX MICROSCOPIC
Bilirubin Urine: NEGATIVE
Glucose, UA: NEGATIVE mg/dL
Hgb urine dipstick: NEGATIVE
Ketones, ur: NEGATIVE mg/dL
Leukocytes,Ua: NEGATIVE
Nitrite: NEGATIVE
Protein, ur: NEGATIVE mg/dL
Specific Gravity, Urine: 1.023 (ref 1.005–1.030)
pH: 7 (ref 5.0–8.0)

## 2022-08-27 LAB — COMPREHENSIVE METABOLIC PANEL
ALT: 17 U/L (ref 0–44)
AST: 31 U/L (ref 15–41)
Albumin: 4.3 g/dL (ref 3.5–5.0)
Alkaline Phosphatase: 107 U/L (ref 38–126)
Anion gap: 10 (ref 5–15)
BUN: 11 mg/dL (ref 6–20)
CO2: 26 mmol/L (ref 22–32)
Calcium: 9.1 mg/dL (ref 8.9–10.3)
Chloride: 105 mmol/L (ref 98–111)
Creatinine, Ser: 1.05 mg/dL (ref 0.61–1.24)
GFR, Estimated: >60 mL/min (ref >60)
Glucose, Bld: 91 mg/dL (ref 70–99)
Potassium: 4.1 mmol/L (ref 3.5–5.1)
Sodium: 141 mmol/L (ref 135–145)
Total Bilirubin: 0.2 mg/dL — ABNORMAL LOW (ref 0.3–1.2)
Total Protein: 7.2 g/dL (ref 6.5–8.1)

## 2022-08-27 LAB — CBC
HCT: 49.7 % (ref 39.0–52.0)
Hemoglobin: 16.9 g/dL (ref 13.0–17.0)
MCH: 28.5 pg (ref 26.0–34.0)
MCHC: 34 g/dL (ref 30.0–36.0)
MCV: 83.8 fL (ref 80.0–100.0)
Platelets: 157 10*3/uL (ref 150–400)
RBC: 5.93 MIL/uL — ABNORMAL HIGH (ref 4.22–5.81)
RDW: 12.1 % (ref 11.5–15.5)
WBC: 8.2 10*3/uL (ref 4.0–10.5)
nRBC: 0 % (ref 0.0–0.2)

## 2022-08-27 LAB — LIPASE, BLOOD: Lipase: 77 U/L — ABNORMAL HIGH (ref 11–51)

## 2022-08-27 NOTE — ED Triage Notes (Signed)
Pt complains of vomiting, diarrhea and back pain x 3 days. Denies fevers.

## 2023-08-13 ENCOUNTER — Emergency Department
Admission: EM | Admit: 2023-08-13 | Discharge: 2023-08-13 | Disposition: A | Discharge status: Home / Self Care | Payer: MEDICAID | Attending: Emergency Medicine | Admitting: Emergency Medicine

## 2023-08-13 ENCOUNTER — Other Ambulatory Visit: Payer: Self-pay

## 2023-08-13 DIAGNOSIS — H6012 Cellulitis of left external ear: Secondary | ICD-10-CM | POA: Insufficient documentation

## 2023-08-13 DIAGNOSIS — H9202 Otalgia, left ear: Secondary | ICD-10-CM | POA: Diagnosis present

## 2023-08-13 MED ORDER — CEPHALEXIN 500 MG PO CAPS: 500.0000 mg | ORAL_CAPSULE | Freq: Four times a day (QID) | ORAL | 0 refills | Status: AC

## 2023-08-13 NOTE — ED Provider Notes (Signed)
 [facility] Provider Note    Event Date/Time   First MD Initiated Contact with Patient 08/13/23 1658     (approximate)   History   Otalgia   HPI  Jerry Benton is a 33 y.o. male with PMH of bipolar 1, developmental delay presents for evaluation of left ear pain.  Patient states that last night he had a boil just below his ear that he popped.  Now he reports localized swelling and pain extending into the ear.  This is also caused a headache.      Physical Exam   Triage Vital Signs: ED Triage Vitals  Encounter Vitals Group     BP 08/13/23 1611 (!) 140/104     Systolic BP Percentile --      Diastolic BP Percentile --      Pulse Rate 08/13/23 1611 (!) 109     Resp 08/13/23 1611 18     Temp 08/13/23 1611 98 F (36.7 C)     Temp src --      SpO2 08/13/23 1611 100 %     Weight 08/13/23 1610 172 lb (78 kg)     Height 08/13/23 1610 5\' 6"  (1.676 m)     Head Circumference --      Peak Flow --      Pain Score 08/13/23 1610 7     Pain Loc --      Pain Education --      Exclude from Growth Chart --     Most recent vital signs: Vitals:   08/13/23 1611  BP: (!) 140/104  Pulse: (!) 109  Resp: 18  Temp: 98 F (36.7 C)  SpO2: 100%   General: Awake, no distress.  CV:  Good peripheral perfusion.  Resp:  Normal effort.  Abd:  No distention.  Other:  Area of scabbing where her earlobe meets side of the head with surrounding swelling, erythema, induration and warmth.  No fluctuance.  EAC is clear, TM with scarring from ear tubes but translucent.   ED Results / Procedures / Treatments   Labs (all labs ordered are listed, but only abnormal results are displayed) Labs Reviewed - No data to display    PROCEDURES:  Critical Care performed: No  Procedures   MEDICATIONS ORDERED IN ED: Medications - No data to display   IMPRESSION / MDM / ASSESSMENT AND PLAN / ED COURSE  I reviewed the triage vital signs and the nursing notes.                              33 year old male presents for evaluation of left otalgia.  Blood pressure and heart rate are elevated otherwise vital signs are stable.  Patient NAD on exam.  Differential diagnosis includes, but is not limited to, otitis externa, otitis media, cellulitis, folliculitis, abscess.  Patient's presentation is most consistent with acute, uncomplicated illness.  Patient's presentation is consistent with a cellulitis.  Area was indurated but there is no fluctuance or did not feel that I&D was needed at this time.  Will start patient on oral antibiotics.  He can continue to use topical antibiotic ointment.  Patient voiced understanding, all questions answered, he is stable at discharge.      FINAL CLINICAL IMPRESSION(S) / ED DIAGNOSES   Final diagnoses:  Cellulitis of left ear     Rx / DC Orders   ED Discharge Orders  Ordered    cephALEXin (KEFLEX) 500 MG capsule  4 times daily        08/13/23 1701             Note:  This document was prepared using Dragon voice recognition software and may include unintentional dictation errors.   Phyliss Breen, PA-C 08/13/23 1723    Lubertha Rush, MD 08/13/23 [phone]

## 2023-08-13 NOTE — ED Triage Notes (Signed)
 Pt comes with left ear pain and swelling that started today. Pt states he woke up like this. Pt states pain down in ear. Pt states headache with pressure too.

## 2023-08-13 NOTE — Discharge Instructions (Addendum)
 Please take the antibiotics as prescribed. You can continue to use neosporin.

## 2023-09-14 NOTE — Progress Notes (Signed)
 MED-PEDS ANNUAL VISIT  Subjective:   PCP: Chung, Aimee Byonghee, MD  Jerry Benton is a 33 y.o. male here for his annual exam.   History of Present Illness  He is undergoing nicotine replacement therapy as part of a smoking cessation program. He smokes approximately one pack of cigarettes per day and uses marijuana, consuming about three blunts daily. He has made some lifestyle changes to reduce smoking and working on this actively.  He has a history of shortness of breath and coughing. He is awaiting a pulmonary referral, which was placed on Aug 15, 2023. His mother has COPD, and there is a family history of cancer, raising concerns about his own risk. Is aware that his smoking is likely contributing to his sxs.  He has a history of psychiatric conditions and sees Dr. Tanja at [facility] once a month for medication management. He also receives peer support therapy once a week at home. His depression screen was negative, but he reports some anxiety symptoms such as feeling nervous, anxious, and irritable. Has had weight gain in interval span, which he thinks could be from his psychiatric medications. Working on eating right and adding activity. His psych med regimen: Mirtazapine 7.5 mg at night for sleep, Invega  (paliperidone ) 9 mg in the morning, Compazine  10 mg twice a day as needed, and Venlafaxine 37.5 mg daily. He recently started Compazine  yesterday and has used it once so far.  He also reports that after stopping one of his psych meds, he stopped having all the GI sxs he was having.  Socially, he lives with his mother and receives various support services including med management, peer support, housing, food stamps, Medicaid, and special assistance. He requires FL2 completion so that he can continue to receive these therapies. Wt trend. Wt Readings from Last 10 Encounters:  09/14/23 77.1 kg (170 lb)  07/28/23 78.6 kg (173 lb 4.8 oz)  12/17/21 65.5 kg (144 lb 6.4 oz)  11/06/21 62.2  kg (137 lb 1.6 oz)  10/27/21 64 kg (141 lb)  07/23/21 60.8 kg (134 lb)  07/14/21 66.1 kg (145 lb 12.8 oz)  04/23/21 65.8 kg (145 lb)  03/14/21 66.6 kg (146 lb 12.8 oz)  12/11/20 63.3 kg (139 lb 9.6 oz)    Lastly, he did not have to use scabies treatment, as he found out he did not have exposure and is good.    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 (CMS/HHS-HCC) S06.9XAS, F02.A0   03/19/2016 - Present    Impulse control disorder F63.9   01/21/2015 - Present    Tobacco use disorder F17.200   01/21/2015 - Present    Schizoaffective disorder, bipolar type (CMS-HCC) F25.0   04/22/2014 - Present    Intellectual disability F79   02/12/2011 - Present    History of ADHD Z86.59   10/29/2010 - Present    Bipolar disorder (CMS/HHS-HCC) F31.9   10/29/2010 - Present    RESOLVED: Aspiration into lower respiratory tract T17.800A   12/26/2011 - 08/07/2015    RESOLVED: Benzodiazepine withdrawal (CMS-HCC) F13.939   12/25/2011 - 08/07/2015    RESOLVED: Hypernatremia E87.0   12/25/2011 - 08/07/2015    RESOLVED: Clostridium difficile diarrhea A04.72   12/25/2011 - 08/07/2015    RESOLVED: Thrombocytopenia (CMS-HCC)  D69.6   12/24/2011 - 03/19/2016    RESOLVED: Lithium toxicity T56.891A   12/23/2011 - 08/07/2015    RESOLVED: Essential tremor G25.0   12/23/2011 - 03/19/2016    RESOLVED: Asthma (HHS-HCC) J45.909   06/05/2011 - 12/23/2017    RESOLVED: Unspecified psychosis (CMS/HHS-HCC) F29   10/29/2010 - 08/07/2015    RESOLVED: Cardiac dysrhythmia, unspecified I49.9   10/29/2010 - 01/08/2017    Past Medical History:  Diagnosis Date  . Anxiety   . Asthma (HHS-HCC) 06/05/2011  . GERD (gastroesophageal reflux disease)   . Intellectual disability 02/12/2011  . Mild major neurocognitive  disorder as late effect of traumatic brain injury without behavioral disturbance (CMS/HHS-HCC) 03/19/2016    Past Surgical History:  Procedure Laterality Date  . HERNIA REPAIR  1991   Inguinal  . ESOPHAGOGASTRODOUDENOSCOPY W/BIOPSY N/A 01/08/2022   Procedure: ESOPHAGOGASTRODUODENOSCOPY, FLEXIBLE, TRANSORAL; WITH BIOPSY, SINGLE OR MULTIPLE;  Surgeon: Cherylin Saddie Tinnie Vicci, MD;  Location: [facility] ENDO/BRON[facility];  Service: Gastroenterology;  Laterality: N/A;  . ARTHROSCOPIC ROTATOR CUFF REPAIR Left   . Ear tube surgery    . ORAL SURGERY OPERATIVE NOTE     multiple extractions  . TONSILLECTOMY & ADENOIDECTOMY      Medications: Current Outpatient Medications  Medication Sig Dispense Refill  . albuterol 90 mcg/actuation inhaler Inhale 2 inhalations into the lungs every 6 (six) hours as needed for Wheezing    . esomeprazole (NEXIUM) 40 MG DR capsule Take 1 capsule (40 mg total) by mouth once daily for 90 days 30 capsule 2  . mirtazapine (REMERON) 7.5 MG tablet Take 7.5 mg by mouth at bedtime    . nicotine (NICODERM CQ) 21 mg/24 hr patch Apply one patch daily. Make sure to rotate location of patch daily. 30 patch 0  . nicotine polacrilex (NICORETTE) 4 MG gum Chew gum a few times then place between cheek and gum. Use every 30 minutes as needed for smoking urges. Max 10 per day. 100 each 0  . paliperidone  (INVEGA ) 9 MG 24 hr tablet Take 1 tablet (9 mg total) by mouth every morning    . polyethylene glycol (MIRALAX) packet Take 1 packet (17 g total) by mouth once daily Mix in 4-8ounces of fluid prior to taking. 30 packet 12  . prochlorperazine  (COMPAZINE ) 10 MG tablet Take 10 mg by mouth 2 (two) times daily as needed    . rizatriptan (MAXALT) 10 MG tablet Take 1 tab at onset of migraine. May repeat in 2 hours if needed. Max 3/day and 2-3 days/week 12 tablet 6  . traZODone  (DESYREL ) 100 MG tablet Take 200 mg by mouth at bedtime     . venlafaxine (EFFEXOR-XR) 37.5 MG XR capsule Take 37.5 mg by  mouth once daily     No current facility-administered medications for this visit.     Allergies: Allergies  Allergen Reactions  . Lithium Sulfate Other (See Comments)  . Lamictal [Lamotrigine] Rash     Family History:  Family History  Problem Relation Name Age of Onset  . Migraines Mother    . Bipolar disorder Mother    . Asthma Mother    . Depression Mother    . COPD Mother    . Substance Abuse Mother    . No Known Problems Father    . No Known Problems Sister    . High blood pressure (Hypertension) Maternal Grandmother    . Breast cancer Maternal Grandmother    . Bipolar disorder Maternal Grandmother    . Suicidality Maternal Grandfather    .  Colon cancer Paternal Grandmother    . Throat cancer Paternal Grandfather    . Lung cancer Paternal Grandfather      Social History: Social History   Socioeconomic History  . Marital status: Single  . Highest education level: 9th grade  Tobacco Use  . Smoking status: Every Day    Current packs/day: 1.00    Average packs/day: 1 pack/day for 22.5 years (22.5 ttl pk-yrs)    Types: Cigarettes    Start date: 2003  . Smokeless tobacco: Never  Vaping Use  . Vaping status: Some Days  Substance and Sexual Activity  . Alcohol use: Yes    Alcohol/week: 3.0 standard drinks of alcohol    Types: 3 Cans of beer per week    Comment: Rare - once in a blue moon  . Drug use: Yes    Frequency: 7.0 times per week    Types: Marijuana    Comment: CBD  . Sexual activity: Yes    Partners: Female    Birth control/protection: None    Comment: w/ current GF  Social History Narrative   04/23/2021      Lives with mother currently.      Occupation: on disability mental health disability   Electrical engineer working   CO monitor present   No guns    Pitbull as pet.   Social Drivers of Corporate investment banker Strain: Low Risk  (09/13/2023)   Overall Financial Resource Strain (CARDIA)   . Difficulty of Paying Living Expenses: Not hard  at all  Food Insecurity: No Food Insecurity (09/13/2023)   Hunger Vital Sign   . Worried About Programme researcher, broadcasting/film/video in the Last Year: Never true   . Ran Out of Food in the Last Year: Never true  Transportation Needs: No Transportation Needs (09/13/2023)   PRAPARE - Transportation   . Lack of Transportation (Medical): No   . Lack of Transportation (Non-Medical): No     Review of Systems Pertinent items are noted in HPI.   Objective:   Vitals:   09/14/23 0904  BP: 107/73  Pulse: 86  Weight: 77.1 kg (170 lb)  Height: 167.6 cm (5' 6)   Wt Readings from Last 3 Encounters:  09/14/23 77.1 kg (170 lb)  07/28/23 78.6 kg (173 lb 4.8 oz)  12/17/21 65.5 kg (144 lb 6.4 oz)   Body mass index is 27.44 kg/m.  Gen:      Alert, cooperative, no distress Head:    Normocephalic, without obvious abnormality, atraumatic Eyes:    PERRL, conjunctiva/corneas clear, EOM's intact Ears:    Normal TM's and external ear canals Nose:   Nares normal, septum midline, mucosa normal, no drainage Throat:   OP clear, edentulous Neck:   Supple, no adenopathy; thyroid: no enlargement/tenderness/nodules Lungs:   Clear to auscultation bilaterally, respirations unlabored Heart:    Regular rate and rhythm, S1 and S2 normal, no murmur, rub or gallop Abd:     Soft, non-tender, bowel sounds active, no masses Extrem:  No LE edema Pulses:  2+ DPs, symmetric Skin:   Has small blister along 2nd DIP region L at joint that spans width of finger and is 1.5 mm in width Lymph:  Cervical, supraclavicular nodes normal Neuro:   Normal muscle bulk and tone.  Gait normal.   PHQ 2/9 last 3 flowsheet values     04/23/2021    1:33 PM 08/28/2023    9:05 AM 09/13/2023   10:20 PM  PHQ-2/9 Depression Screening  Little interest or pleasure in doing things  0 0  Feeling down, depressed, or hopeless  0 0  Patient Health Questionnaire-2 Score  0 * * 0 *  (OBSOLETE) Little interest or pleasure in doing things 0    (OBSOLETE) Feeling  down, depressed, or hopeless (or irritable for Teens only)? 0    (OBSOLETE) Total Prescreening Score 0    (OBSOLETE) Total Score = 0      * Patient-reported  * Data saved with a previous flowsheet row definition    GAD 7 result:     09/13/2023  GAD-7 w/ Score  Feeling nervous, anxious, or on edge More than half the days  Not being able to stop or control worrying Not at all  Worrying too much about different things Not at all  Trouble relaxing More than half the days  Being so restless that it is hard to sit still More than half the days  Becoming easily annoyed or irritable More than half the days  Feeling afraid as if something awful might happen Not at all  GAD-7 Total Score 8 *  How difficult have these problems made it for you to do your work, take care of things at home, or get along with other people? Somewhat difficult    * Patient-reported     Anxiety Severity:     0-4 = Minimal Anxiety     5-9 = Mild Anxiety 10-14 = Moderate Anxiety 15-21 = Severe Anxiety   Depression Severity and Treatment Recommendations:  0-4= None  5-9= Mild / Treatment: Support, educate to call if worse; return in one month  10-14= Moderate / Treatment: Support, watchful waiting; Antidepressant or Psycotherapy  15-19= Moderately severe / Treatment: Antidepressant OR Psychotherapy  >= 20 = Major depression, severe / Antidepressant AND Psychotherapy    Assessment & Plan:   Jerry Griffy is a 33 y.o. male here for annual exam.  Diagnoses and all orders for this visit:  Annual physical exam: HM list updated and will obtain below labs today. Will also update STI screen; pt aware of safe sex practices. Mental health screens reviewed; he is followed closely by his psychiatrist and gets routine counseling so will continue med mgt and weekly therapy through his mental health team.   Counseled today re: smoking cessation and THC use; currently in pre-contemplative stage. Receiving smoking  cessation counseling/in program so this is great and will continue to assess how he does.  For his pulm sxs, referral in the system so advised to make appt at front desk, as he has not been able to connect yet. Discussed how his smoking is likely contributing to his sxs and cessation would be prudent, which he is aware of. Appreciate further evaluation through pulm.  From a GI perspective, he feels his N,V is resolved now that he stopped one of his psychiatric medications, suggesting this could have been SE of the medication. Has gained weight in interval span, which is reassuring, as was losing weight before. Discussed general lifestyle mgt as now his BMI is over 25. Will work on healthy eating and exercise.  -     Anxiety Screen -     Depression Screen -(PHQ- 2/9, BDI) -     Lipid Panel W/Calculated Low Density Lipoprotein (LDL) Cholesterol; Future -     Hemoglobin A1C; Future -     HIV-1 And HIV-2 Antibody & Antigen; Future -     Syphilis, Screen - Treponema Pallidum AB with Reflex to RPR;  Future -     Comprehensive Metabolic Panel (CMP); Future -     Complete Blood Count (CBC); Future     Future Appointments     Date/Time Provider Department Center Visit Type   09/28/2023 3:30 PM (Arrive by 3:15 PM) Neysa Burnard Browning, PA [facility]  SMOKING CESSATION VIDEO VISIT RETURN   10/20/2023 8:40 AM (Arrive by 8:25 AM) [facility] SPIROMETRY/DLCO/LUNG VOLUMES   10/26/2023 8:00 AM Vernette Jeoffrey Parkin, MD [facility] NEW SEVERE ASTHMA   01/20/2024 9:20 AM (Arrive by 9:05 AM) Constant, Recardo Sack, PA [facility] RETURN       Orders Placed This Encounter  Procedures  . HIV-1 And HIV-2 Antibody & Antigen    Standing Status:   Future    Number of Occurrences:   1    Expected Date:   09/14/2023    Expiration Date:   01/14/2024    Release to patient:    Immediate  . Lipid Panel W/Calculated Low Density Lipoprotein (LDL) Cholesterol    Standing Status:   Future    Number of Occurrences:   1    Expected Date:   09/14/2023    Expiration Date:   01/14/2024    Release to patient:   Immediate  . Hemoglobin A1C    Standing Status:   Future    Number of Occurrences:   1    Expected Date:   09/14/2023    Expiration Date:   01/14/2024    Release to patient:   Immediate  . Syphilis, Screen - Treponema Pallidum AB with Reflex to RPR    Standing Status:   Future    Number of Occurrences:   1    Expected Date:   09/14/2023    Expiration Date:   01/14/2024    Release to patient:   Immediate  . Comprehensive Metabolic Panel (CMP)    Standing Status:   Future    Number of Occurrences:   1    Expected Date:   09/14/2023    Expiration Date:   01/14/2024    Release to patient:   Immediate  . Complete Blood Count (CBC)    Standing Status:   Future    Number of Occurrences:   1    Expected Date:   09/14/2023    Expiration Date:   01/14/2024    Release to patient:   Immediate  . Anxiety Screen  . Depression Screen -(PHQ- 2/9, BDI)        *Some images could not be shown.

## 2023-09-22 ENCOUNTER — Other Ambulatory Visit: Payer: Self-pay

## 2023-09-22 ENCOUNTER — Emergency Department
Admission: EM | Admit: 2023-09-22 | Discharge: 2023-09-22 | Disposition: A | Discharge status: Home / Self Care | Payer: MEDICAID | Attending: Emergency Medicine | Admitting: Emergency Medicine

## 2023-09-22 DIAGNOSIS — H6123 Impacted cerumen, bilateral: Secondary | ICD-10-CM | POA: Diagnosis not present

## 2023-09-22 DIAGNOSIS — J069 Acute upper respiratory infection, unspecified: Secondary | ICD-10-CM | POA: Diagnosis not present

## 2023-09-22 DIAGNOSIS — F17210 Nicotine dependence, cigarettes, uncomplicated: Secondary | ICD-10-CM | POA: Diagnosis not present

## 2023-09-22 DIAGNOSIS — J45909 Unspecified asthma, uncomplicated: Secondary | ICD-10-CM | POA: Diagnosis not present

## 2023-09-22 DIAGNOSIS — R509 Fever, unspecified: Secondary | ICD-10-CM | POA: Diagnosis present

## 2023-09-22 LAB — RESP PANEL BY RT-PCR (RSV, FLU A&B, COVID)  RVPGX2
Influenza A by PCR: NEGATIVE
Influenza B by PCR: NEGATIVE
Resp Syncytial Virus by PCR: NEGATIVE
SARS Coronavirus 2 by RT PCR: NEGATIVE

## 2023-09-22 NOTE — ED Triage Notes (Signed)
 Pt to Ed via POV from home. Pt reports HA, fever, body pains, fatigue and SOB since yesterday. Pt reports fever of 101. Denies CP. Pt reports HA 7/10.

## 2023-09-22 NOTE — ED Provider Notes (Signed)
 [facility] Provider Note    Event Date/Time   First MD Initiated Contact with Patient 09/22/23 1459     (approximate)   History   Headache   HPI Jerry Benton is a 33 y.o. male presenting to the emergency department with headache, fever, fatigue, right otalgia x 2 days.  Fevers of 99-101 at home.  Has been using Motrin  at home. Denies chest pain, SOB, vomiting, diarrhea, sore throat, difficulty swallowing, fall, or injury, hearing loss.  Past medical history includes bipolar 1 disorder, insomnia, cannabis abuse, asthma, PTSD, autism, anxiety, b/l tympanostomy tubes.  No sick contacts.  No recent travel. Smokes cigarettes.      Physical Exam   Triage Vital Signs: ED Triage Vitals  Encounter Vitals Group     BP 09/22/23 1453 124/73     Girls Systolic BP Percentile --      Girls Diastolic BP Percentile --      Boys Systolic BP Percentile --      Boys Diastolic BP Percentile --      Pulse Rate 09/22/23 1453 81     Resp 09/22/23 1453 18     Temp 09/22/23 1453 98.8 F (37.1 C)     Temp Source 09/22/23 1453 Oral     SpO2 09/22/23 1453 98 %     Weight 09/22/23 1452 170 lb (77.1 kg)     Height 09/22/23 1452 5' 6 (1.676 m)     Head Circumference --      Peak Flow --      Pain Score 09/22/23 1452 7     Pain Loc --      Pain Education --      Exclude from Growth Chart --     Most recent vital signs: Vitals:   09/22/23 1453  BP: 124/73  Pulse: 81  Resp: 18  Temp: 98.8 F (37.1 C)  SpO2: 98%    General: Well-appearing, in no acute distress. Appears stated age. Head: Normocephalic, atraumatic. Eyes: PERRLA. EOMs intact. No scleral icterus or conjunctival injection. Ears/Nose/Throat: TMs intact b/l, dark brown cerumen present in ear canals b/l. Nares patent, no nasal discharge. Oropharynx moist, no erythema or exudate. Dentition intact. Neck: Supple, no lymphadenopathy. CV: Regular rate, 81 bpm. Peripheral pulses 2+ and symmetric. No  edema. Respiratory: Breath sounds clear b/l. No wheezes, rales, or rhonchi. No respiratory distress. Normal respiratory effort. GI: Soft, non-distended, non-tender. No rebound or guarding.  MSK: Normal ROM and  5/5 strength in bilateral upper extremities.  Skin:Warm, dry, intact. No rashes, lesions, or ecchymosis. No cyanosis or pallor. Neurological: A&Ox4 to person, place, time, and situation.   ED Results / Procedures / Treatments   Labs (all labs ordered are listed, but only abnormal results are displayed) Labs Reviewed  RESP PANEL BY RT-PCR (RSV, FLU A&B, COVID)  [id number]     EKG     RADIOLOGY     PROCEDURES:  Critical Care performed: No  Procedures   MEDICATIONS ORDERED IN ED: Medications - No data to display   IMPRESSION / MDM / ASSESSMENT AND PLAN / ED COURSE  I reviewed the triage vital signs and the nursing notes.                              Differential diagnosis includes, but is not limited to, other viral URI, COVID, flu, RSV, asthma exacerbation  Patient's presentation is most consistent with acute complicated illness /  injury requiring diagnostic workup.  Patient is a 33 year old male who presented today with upper respiratory symptoms x 2 days.  Respiratory panel ordered, negative for COVID, flu, RSV.  Patient did not have a fever in the emergency department has been treating with Motrin  at home.  Discussed how this may be another viral URI that we do not test for in the emergency department.  Discussed symptomatic treatment at home including increased fluids, Tylenol  and ibuprofen  for pain and fever.  He also had bilateral cerumen impaction.  With his tympanostomy tubes, I will think irrigation is a good idea.  We discussed picking up Debrox OTC drops.  All vital signs within normal range.  He can follow-up with his primary care provider for any new or worsening symptoms.  Patient was given the opportunity to ask questions; all questions were answered.  Emergency department return precautions were discussed with the patient.  Patient is in agreement to the treatment plan.  Patient is stable for discharge.      FINAL CLINICAL IMPRESSION(S) / ED DIAGNOSES   Final diagnoses:  Viral upper respiratory tract infection  Bilateral impacted cerumen     Rx / DC Orders   ED Discharge Orders     None        Note:  This document was prepared using Dragon voice recognition software and may include unintentional dictation errors.    Sheron Salm, PA-C 09/22/23 1717    Viviann Pastor, MD 09/22/23 [phone]

## 2023-09-22 NOTE — Discharge Instructions (Addendum)
 You have been seen in the Emergency Department (ED) today for a likely viral illness.  Please drink plenty of clear fluids (water, Gatorade, chicken broth, etc).  You may use Tylenol  and/or Motrin  according to label instructions.  You can alternate between the two without any side effects.   Please pick up over the counter Debrox drops for earwax.  Please follow up with your doctor as listed above.  Call your doctor or return to the Emergency Department (ED) if you are unable to tolerate fluids due to vomiting, have worsening trouble breathing, become extremely tired or difficult to awaken, or if you develop any other symptoms that concern you.
